# Patient Record
Sex: Female | Born: 1995
Health system: Southern US, Community
[De-identification: ages and names within clinical notes are randomized; demographics above are authoritative.]

## PROBLEM LIST (undated history)

## (undated) ENCOUNTER — Emergency Department (HOSPITAL_COMMUNITY): Admission: EM | Payer: Medicaid Other | Source: Home / Self Care

## (undated) DIAGNOSIS — K529 Noninfective gastroenteritis and colitis, unspecified: Secondary | ICD-10-CM

## (undated) DIAGNOSIS — K5900 Constipation, unspecified: Secondary | ICD-10-CM

## (undated) DIAGNOSIS — Q796 Ehlers-Danlos syndrome, unspecified: Secondary | ICD-10-CM

## (undated) DIAGNOSIS — K219 Gastro-esophageal reflux disease without esophagitis: Secondary | ICD-10-CM

## (undated) DIAGNOSIS — S42309A Unspecified fracture of shaft of humerus, unspecified arm, initial encounter for closed fracture: Secondary | ICD-10-CM

## (undated) DIAGNOSIS — F329 Major depressive disorder, single episode, unspecified: Secondary | ICD-10-CM

## (undated) DIAGNOSIS — G4700 Insomnia, unspecified: Secondary | ICD-10-CM

## (undated) DIAGNOSIS — R197 Diarrhea, unspecified: Secondary | ICD-10-CM

## (undated) DIAGNOSIS — F32A Depression, unspecified: Secondary | ICD-10-CM

## (undated) DIAGNOSIS — F938 Other childhood emotional disorders: Secondary | ICD-10-CM

## (undated) DIAGNOSIS — J45909 Unspecified asthma, uncomplicated: Secondary | ICD-10-CM

## (undated) DIAGNOSIS — F419 Anxiety disorder, unspecified: Secondary | ICD-10-CM

## (undated) HISTORY — DX: Anxiety disorder, unspecified: F41.9

## (undated) HISTORY — DX: Major depressive disorder, single episode, unspecified: F32.9

## (undated) HISTORY — DX: Depression, unspecified: F32.A

## (undated) HISTORY — DX: Constipation, unspecified: K59.00

## (undated) HISTORY — DX: Diarrhea, unspecified: R19.7

## (undated) HISTORY — DX: Gastro-esophageal reflux disease without esophagitis: K21.9

## (undated) HISTORY — PX: NO PAST SURGERIES: SHX2092

---
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
## 2018-08-18 ENCOUNTER — Telehealth: Payer: Self-pay | Admitting: General Surgery

## 2018-08-18 DIAGNOSIS — R1011 Right upper quadrant pain: Secondary | ICD-10-CM

## 2018-08-18 NOTE — Telephone Encounter (Signed)
Contacted the patients mother Bonita Quin at [phone] to pre-screen [facility] for new patient virtual appointment on 08/19/2018. Bonita Quin asked if I would call back later because she was on a zoom appt.

## 2018-08-19 ENCOUNTER — Other Ambulatory Visit: Payer: Self-pay

## 2018-08-19 ENCOUNTER — Ambulatory Visit (INDEPENDENT_AMBULATORY_CARE_PROVIDER_SITE_OTHER): Payer: Medicaid Other | Admitting: Gastroenterology

## 2018-08-19 VITALS — Ht 64.0 in | Wt 115.0 lb

## 2018-08-19 DIAGNOSIS — K582 Mixed irritable bowel syndrome: Secondary | ICD-10-CM

## 2018-08-19 DIAGNOSIS — R112 Nausea with vomiting, unspecified: Secondary | ICD-10-CM | POA: Diagnosis not present

## 2018-08-19 MED ORDER — ONDANSETRON HCL 4 MG PO TABS: 4.0000 mg | ORAL_TABLET | Freq: Two times a day (BID) | ORAL | 5 refills | Status: DC | PRN

## 2018-08-19 NOTE — Progress Notes (Signed)
This service was provided via virtual visit.  We tried connecting by [facility] audio and visual however this was not successful and so we went with audio only.  The patient was located at home.  I was located in my office.  The patient did consent to this virtual visit and is aware of possible charges through their insurance for this visit.  The patient is a new patient.  My certified medical assistant, Barbaraann Rondo, contributed to this visit by contacting the patient by phone 1 or 2 business days prior to the appointment and also followed up on the recommendations I made after the visit.  Time spent on virtual visit: 21 minutes   HPI: This is a very pleasant 23 year old woman  Nausea and vomiting.  For many years.  She has nausea often throughout the day.  She vomits sometimes 2-3 times per day. Ever since she was 63, since she started psych meds.  It sounds like she was evaluated for a variety of GI issues with abdominal pains, reflux, nausea and vomiting many years ago without clear diagnosis.  She believes that the symptoms are from her psych meds and certainly that may be the case.  She has been on a variety of antinausea medicines over the years and feels that the Solutab, dissolvable types worked the best.  She tells me she also has alternating constipation and diarrhea.  She can have no bowel movement for about 2 weeks and then diarrhea for 1 week.  Never sees blood in her stool.  Lithium, lamictal, gabapentin and Minipress all can cause nausea and vomiting   Weight down about 15 pounds in the past year.   she used to get heartburn quite a lot but that stopped years ago.  She does not need any antiacid medicines now.  No dysphagia.  She takes ibuprofen two 200mg  pills, usually works well. About twice per week.  She has alternating diarrhea, constipation.  1 week of diarrhea after 2 weeks of constipation.  Alternates.  She had an xray 22month ago and it showed she was full of stools  She  had an EGD 5-6 ago because of the same symptoms. She is not sure what it showed.  She does not know where it was done.  Chief complaint is chronic nausea, vomiting, alternating IBS  ROS: complete GI ROS as described in HPI, all other review negative.  Constitutional:  No unintentional weight loss   Past Medical History:  Diagnosis Date  . Anxiety   . Anxiety disorder of adolescence 06/09/2015  . Arm fracture   . Asthma   . Constipation    alternates with diarrhea  . Depression   . Diarrhea    alternates with constipation  . GERD (gastroesophageal reflux disease)   . Inflammatory bowel disease   . Insomnia 06/09/2015    Past Surgical History:  Procedure Laterality Date  . NO PAST SURGERIES      Current Outpatient Medications  Medication Sig Dispense Refill  . albuterol (PROVENTIL HFA;VENTOLIN HFA) 108 (90 BASE) MCG/ACT inhaler Inhale 2 puffs into the lungs every 6 (six) hours as needed for wheezing or shortness of breath.    . etonogestrel (NEXPLANON) 68 MG IMPL implant 1 each by Subdermal route once.    . gabapentin (NEURONTIN) 100 MG capsule Take 100 mg by mouth at bedtime.     . lamoTRIgine (LAMICTAL) 150 MG tablet Take 150 mg by mouth daily.    Marland Kitchen lithium 300 MG tablet Take 300 mg  by mouth 2 (two) times daily.    . prazosin (MINIPRESS) 1 MG capsule Take 1 mg by mouth at bedtime.     No current facility-administered medications for this visit.     Allergies as of 08/19/2018 - Review Complete 08/19/2018  Allergen Reaction Noted  . Bupropion Hives and Palpitations 08/19/2018    Family History  Problem Relation Age of Onset  . Drug abuse Mother   . Depression Mother   . Diabetes Maternal Grandmother   . Cancer Maternal Grandfather        leukemia  . Cancer Maternal Aunt        renal  . Depression Maternal Aunt   . Coronary artery disease Unknown   . Diabetes Unknown        paternal side  + hx diabetes  . Other Unknown        Brain Tumor  . Alcohol abuse  Maternal Aunt   . Depression Maternal Aunt   . Alcohol abuse Maternal Uncle   . Depression Maternal Uncle     Social History   Socioeconomic History  . Marital status: Single    Spouse name: Not on file  . Number of children: 0  . Years of education: 45  . Highest education level: Not on file  Occupational History  . Occupation: Freight forwarder  Social Needs  . Financial resource strain: Not on file  . Food insecurity:    Worry: Not on file    Inability: Not on file  . Transportation needs:    Medical: Not on file    Non-medical: Not on file  Tobacco Use  . Smoking status: Current Every Day Smoker    Packs/day: 0.25    Years: 1.00    Pack years: 0.25  . Smokeless tobacco: Never Used  Substance and Sexual Activity  . Alcohol use: No  . Drug use: No  . Sexual activity: Yes    Birth control/protection: Implant  Lifestyle  . Physical activity:    Days per week: Not on file    Minutes per session: Not on file  . Stress: Not on file  Relationships  . Social connections:    Talks on phone: Not on file    Gets together: Not on file    Attends religious service: Not on file    Active member of club or organization: Not on file    Attends meetings of clubs or organizations: Not on file    Relationship status: Not on file  . Intimate partner violence:    Fear of current or ex partner: Not on file    Emotionally abused: Not on file    Physically abused: Not on file    Forced sexual activity: Not on file  Other Topics Concern  . Not on file  Social History Narrative   Lives with mother, Bonita Quin   Caffeine use: 1/day - coffee/tea     Physical Exam: Unable to perform because this was a "telemed visit" due to current Covid-19 pandemic  Assessment and plan: 23 y.o. female with chronic nausea, vomiting and also alternating IBS  This certainly very possible that she is having side effects of her several psych meds.  She says she is not able to adjust any other doses and there  are no alternative regimens that she is aware of.  She does not really want to mess with those medicines and I completely understand that.  We will try to gather some information from her previous  GI work-ups including an EGD that she had sometime in the past 5 or 6 years however she is not really aware of where she had it done or what was found.  I recommended repeating that test now since certainly something may have changed in the interim.  She might have H. pylori infection, peptic ulcer disease, significant gastritis or reflux related damage.  We will give her prescription for dissolvable Zofran 4 mg tablets, 1 pill twice daily as needed for nausea and I am happy to refill that as needed.  I do think it is probably very important that she does not have significant vomiting so that she is able to keep her psych meds down and absorb them.  She will get a basic set of labs including a CBC, complete metabolic profile, thyroid testing and celiac sprue testing.  She will try fiber supplements for her alternating constipation and diarrhea that is likely alternating IBS.  Please see the "Patient Instructions" section for addition details about the plan.  Rob Buntinganiel Jacobs, MD [facility] 08/19/2018, 2:42 PM

## 2018-08-19 NOTE — Patient Instructions (Addendum)
We will try to track down her recent abdominal films done I believe in [place].  We will also see if we get records from her upper endoscopy done 5 or 6 years ago.  She is not really sure where this was done however.  [place]  She will have a basic set of blood work including a CBC, complete metabolic profile, TSH, celiac sprue testing with TTG and total IgA level.  Upper endoscopy at her soonest convenience as well for nausea, vomiting  New prescription for ondansetron 4 mg ODT, 1 tablet twice daily as needed for nausea, dispense 60 with 5 refills.  Please also recommend that she start taking Citrucel over-the-counter fiber supplement for her alternating constipation and diarrhea.  Thank you for entrusting me with your care and choosing [facility].  Dr Christella Hartigan

## 2018-08-20 ENCOUNTER — Telehealth: Payer: Self-pay

## 2018-08-20 NOTE — Telephone Encounter (Signed)
Patient Stated that she would come in today at 3pm for labs and to go over instructions and sign patient acknowledgement form for EGD 08/21/18. She called stating she was vomiting and felt too weak to make it in today. Patient said she will come in the day of her procedure at 930 am to have labs drawn and sign the form. Instructions verbalized over the phone. I also spoke to patients mother who will be her care partner. She assured me that patient will follow the instructions.

## 2018-08-21 ENCOUNTER — Ambulatory Visit (AMBULATORY_SURGERY_CENTER): Payer: Medicaid Other | Admitting: Gastroenterology

## 2018-08-21 ENCOUNTER — Other Ambulatory Visit: Payer: Self-pay

## 2018-08-21 ENCOUNTER — Other Ambulatory Visit (INDEPENDENT_AMBULATORY_CARE_PROVIDER_SITE_OTHER): Payer: Medicaid Other

## 2018-08-21 ENCOUNTER — Encounter: Payer: Self-pay | Admitting: Gastroenterology

## 2018-08-21 VITALS — BP 108/69 | HR 72 | Temp 99.1°F | Resp 20 | Ht 64.0 in | Wt 115.0 lb

## 2018-08-21 DIAGNOSIS — R112 Nausea with vomiting, unspecified: Secondary | ICD-10-CM | POA: Diagnosis not present

## 2018-08-21 DIAGNOSIS — K582 Mixed irritable bowel syndrome: Secondary | ICD-10-CM

## 2018-08-21 LAB — COMPREHENSIVE METABOLIC PANEL
ALT: 11 U/L (ref 0–35)
AST: 15 U/L (ref 0–37)
Albumin: 4.7 g/dL (ref 3.5–5.2)
Alkaline Phosphatase: 66 U/L (ref 39–117)
BUN: 7 mg/dL (ref 6–23)
CO2: 27 mEq/L (ref 19–32)
Calcium: 9.7 mg/dL (ref 8.4–10.5)
Chloride: 103 mEq/L (ref 96–112)
Creatinine, Ser: 0.8 mg/dL (ref 0.40–1.20)
GFR: 91.07 mL/min (ref >60.00)
Glucose, Bld: 89 mg/dL (ref 70–99)
Potassium: 4 mEq/L (ref 3.5–5.1)
Sodium: 138 mEq/L (ref 135–145)
Total Bilirubin: 0.3 mg/dL (ref 0.2–1.2)
Total Protein: 7.4 g/dL (ref 6.0–8.3)

## 2018-08-21 LAB — CBC WITH DIFFERENTIAL/PLATELET
Basophils Absolute: 0.1 10*3/uL (ref 0.0–0.1)
Basophils Relative: 1.1 % (ref 0.0–3.0)
Eosinophils Absolute: 0.2 10*3/uL (ref 0.0–0.7)
Eosinophils Relative: 3.4 % (ref 0.0–5.0)
HCT: 40.3 % (ref 36.0–46.0)
Hemoglobin: 14.1 g/dL (ref 12.0–15.0)
Lymphocytes Relative: 35.4 % (ref 12.0–46.0)
Lymphs Abs: 2.1 10*3/uL (ref 0.7–4.0)
MCHC: 35.1 g/dL (ref 30.0–36.0)
MCV: 86.7 fl (ref 78.0–100.0)
Monocytes Absolute: 0.4 10*3/uL (ref 0.1–1.0)
Monocytes Relative: 7 % (ref 3.0–12.0)
Neutro Abs: 3.1 10*3/uL (ref 1.4–7.7)
Neutrophils Relative %: 53.1 % (ref 43.0–77.0)
Platelets: 347 10*3/uL (ref 150.0–400.0)
RBC: 4.65 Mil/uL (ref 3.87–5.11)
RDW: 12.8 % (ref 11.5–14.6)
WBC: 5.8 10*3/uL (ref 4.5–10.5)

## 2018-08-21 LAB — IGA: IgA: 186 mg/dL (ref 68–378)

## 2018-08-21 LAB — TSH: TSH: 1.04 u[IU]/mL (ref 0.35–5.50)

## 2018-08-21 MED ORDER — SODIUM CHLORIDE 0.9 % IV SOLN: 500.0000 mL | Freq: Once | INTRAVENOUS | Status: DC

## 2018-08-21 NOTE — Progress Notes (Signed)
Courtney Washington CMA temps and Judy Branson CMA vitals 

## 2018-08-21 NOTE — Progress Notes (Signed)
Report to PACU, RN, vss, BBS= Clear.  

## 2018-08-21 NOTE — Op Note (Addendum)
[facility] Patient Name: Dana [name] Procedure Date: 08/21/2018 11:12 AM MRN: [id number] Endoscopist: Rachael Fee , MD Age: 23 Referring MD:  Date of Birth: [birth year] Gender: Female Account #: [id number] Procedure:                Upper GI endoscopy Indications:              Nausea with vomiting Medicines:                Monitored Anesthesia Care Procedure:                Pre-Anesthesia Assessment:                           - Prior to the procedure, a History and Physical                            was performed, and patient medications and                            allergies were reviewed. The patient's tolerance of                            previous anesthesia was also reviewed. The risks                            and benefits of the procedure and the sedation                            options and risks were discussed with the patient.                            All questions were answered, and informed consent                            was obtained. Prior Anticoagulants: The patient has                            taken no previous anticoagulant or antiplatelet                            agents. ASA Grade Assessment: I - A normal, healthy                            patient. After reviewing the risks and benefits,                            the patient was deemed in satisfactory condition to                            undergo the procedure.                           After obtaining informed consent, the endoscope was  passed under direct vision. Throughout the                            procedure, the patient's blood pressure, pulse, and                            oxygen saturations were monitored continuously. The                            Model GIF-[id number]) scope was introduced                            through the mouth, and advanced to the second part                            of duodenum. The upper GI endoscopy was                            accomplished without difficulty. The patient                            tolerated the procedure well. Scope In: Scope Out: Findings:                 The esophagus was normal.                           The stomach was normal.                           The examined duodenum was normal. Complications:            No immediate complications. Estimated blood loss:                            None. Estimated Blood Loss:     Estimated blood loss: none. Impression:               - Normal esophagus.                           - Normal stomach.                           - Normal examined duodenum. Recommendation:           - Patient has a contact number available for                            emergencies. The signs and symptoms of potential                            delayed complications were discussed with the                            patient. Return to normal activities tomorrow.  Written discharge instructions were provided to the                            patient.                           - Resume previous diet.                           - Continue present medications. Please start the                            zofran prescription today or tomorrow.                           - Await results of your blood tests. Rachael Feeaniel P Quinn Quam, MD 08/21/2018 11:25:53 AM This report has been signed electronically.

## 2018-08-21 NOTE — Patient Instructions (Signed)
YOU HAD AN ENDOSCOPIC PROCEDURE TODAY AT THE [facility]:   Refer to the procedure report that was given to you for any specific questions about what was found during the examination.  If the procedure report does not answer your questions, please call your gastroenterologist to clarify.  If you requested that your care partner not be given the details of your procedure findings, then the procedure report has been included in a sealed envelope for you to review at your convenience later.  YOU SHOULD EXPECT: Some feelings of bloating in the abdomen. Passage of more gas than usual.  Walking can help get rid of the air that was put into your GI tract during the procedure and reduce the bloating. If you had a lower endoscopy (such as a colonoscopy or flexible sigmoidoscopy) you may notice spotting of blood in your stool or on the toilet paper. If you underwent a bowel prep for your procedure, you may not have a normal bowel movement for a few days.  Please Note:  You might notice some irritation and congestion in your nose or some drainage.  This is from the oxygen used during your procedure.  There is no need for concern and it should clear up in a day or so.  SYMPTOMS TO REPORT IMMEDIATELY:   Following upper endoscopy (EGD)  Vomiting of blood or coffee ground material  New chest pain or pain under the shoulder blades  Painful or persistently difficult swallowing  New shortness of breath  Fever of 100F or higher  Black, tarry-looking stools  For urgent or emergent issues, a gastroenterologist can be reached at any hour by calling [phone].   DIET:  We do recommend a small meal at first, but then you may proceed to your regular diet.  Drink plenty of fluids but you should avoid alcoholic beverages for 24 hours.  ACTIVITY:  You should plan to take it easy for the rest of today and you should NOT DRIVE or use heavy machinery until tomorrow (because of the sedation medicines used  during the test).    FOLLOW UP: Our staff will call the number listed on your records 48-72 hours following your procedure to check on you and address any questions or concerns that you may have regarding the information given to you following your procedure. If we do not reach you, we will leave a message.  We will attempt to reach you two times.  During this call, we will ask if you have developed any symptoms of COVID 19. If you develop any symptoms (ie: fever, flu-like symptoms, shortness of breath, cough etc.) before then, please call [phone].  If you test positive for Covid 19 in the 2 weeks post procedure, please call and report this information to us.    If any biopsies were taken you will be contacted by phone or by letter within the next 1-3 weeks.  Please call us at [phone] if you have not heard about the biopsies in 3 weeks.    SIGNATURES/CONFIDENTIALITY: You and/or your care partner have signed paperwork which will be entered into your electronic medical record.  These signatures attest to the fact that that the information above on your After Visit Summary has been reviewed and is understood.  Full responsibility of the confidentiality of this discharge information lies with you and/or your care-partner. 

## 2018-08-25 ENCOUNTER — Telehealth: Payer: Self-pay

## 2018-08-25 LAB — TISSUE TRANSGLUTAMINASE, IGA: (tTG) Ab, IgA: 1 U/mL

## 2018-08-25 NOTE — Telephone Encounter (Signed)
Second follow up phone call attempt, no answer, message left 

## 2018-08-25 NOTE — Telephone Encounter (Signed)
NO ANSWER, MESSAGE LEFT FOR PATIENT. 

## 2018-08-26 NOTE — Progress Notes (Deleted)
erroneuous

## 2018-08-26 NOTE — Patient Instructions (Signed)
Erroneous encounter

## 2018-08-27 NOTE — Progress Notes (Signed)
Erroneous encounter

## 2019-03-11 ENCOUNTER — Emergency Department (HOSPITAL_BASED_OUTPATIENT_CLINIC_OR_DEPARTMENT_OTHER): Payer: Medicaid Other

## 2019-03-11 ENCOUNTER — Encounter (HOSPITAL_BASED_OUTPATIENT_CLINIC_OR_DEPARTMENT_OTHER): Payer: Self-pay

## 2019-03-11 ENCOUNTER — Emergency Department (HOSPITAL_BASED_OUTPATIENT_CLINIC_OR_DEPARTMENT_OTHER)
Admission: EM | Admit: 2019-03-11 | Discharge: 2019-03-11 | Disposition: A | Discharge status: Home / Self Care | Payer: Medicaid Other | Attending: Emergency Medicine | Admitting: Emergency Medicine

## 2019-03-11 ENCOUNTER — Other Ambulatory Visit: Payer: Self-pay

## 2019-03-11 DIAGNOSIS — Z87891 Personal history of nicotine dependence: Secondary | ICD-10-CM | POA: Insufficient documentation

## 2019-03-11 DIAGNOSIS — R102 Pelvic and perineal pain: Secondary | ICD-10-CM

## 2019-03-11 DIAGNOSIS — J45909 Unspecified asthma, uncomplicated: Secondary | ICD-10-CM | POA: Insufficient documentation

## 2019-03-11 DIAGNOSIS — R1031 Right lower quadrant pain: Secondary | ICD-10-CM | POA: Diagnosis not present

## 2019-03-11 DIAGNOSIS — R109 Unspecified abdominal pain: Secondary | ICD-10-CM | POA: Diagnosis present

## 2019-03-11 LAB — URINALYSIS, ROUTINE W REFLEX MICROSCOPIC
Bilirubin Urine: NEGATIVE
Glucose, UA: NEGATIVE mg/dL
Hgb urine dipstick: NEGATIVE
Ketones, ur: NEGATIVE mg/dL
Nitrite: NEGATIVE
Protein, ur: NEGATIVE mg/dL
Specific Gravity, Urine: <1.005 — ABNORMAL LOW (ref 1.005–1.030)
pH: 6 (ref 5.0–8.0)

## 2019-03-11 LAB — CBC
HCT: 41.7 % (ref 36.0–46.0)
Hemoglobin: 13.7 g/dL (ref 12.0–15.0)
MCH: 29.5 pg (ref 26.0–34.0)
MCHC: 32.9 g/dL (ref 30.0–36.0)
MCV: 89.7 fL (ref 80.0–100.0)
Platelets: 315 10*3/uL (ref 150–400)
RBC: 4.65 MIL/uL (ref 3.87–5.11)
RDW: 11.9 % (ref 11.5–15.5)
WBC: 6.1 10*3/uL (ref 4.0–10.5)
nRBC: 0 % (ref 0.0–0.2)

## 2019-03-11 LAB — COMPREHENSIVE METABOLIC PANEL
ALT: 16 U/L (ref 0–44)
AST: 20 U/L (ref 15–41)
Albumin: 4.5 g/dL (ref 3.5–5.0)
Alkaline Phosphatase: 63 U/L (ref 38–126)
Anion gap: 9 (ref 5–15)
BUN: 9 mg/dL (ref 6–20)
CO2: 24 mmol/L (ref 22–32)
Calcium: 9.4 mg/dL (ref 8.9–10.3)
Chloride: 104 mmol/L (ref 98–111)
Creatinine, Ser: 0.86 mg/dL (ref 0.44–1.00)
GFR calc Af Amer: >60 mL/min (ref >60)
GFR calc non Af Amer: >60 mL/min (ref >60)
Glucose, Bld: 112 mg/dL — ABNORMAL HIGH (ref 70–99)
Potassium: 3.9 mmol/L (ref 3.5–5.1)
Sodium: 137 mmol/L (ref 135–145)
Total Bilirubin: 0.8 mg/dL (ref 0.3–1.2)
Total Protein: 7.2 g/dL (ref 6.5–8.1)

## 2019-03-11 LAB — URINALYSIS, MICROSCOPIC (REFLEX): RBC / HPF: NONE SEEN RBC/hpf (ref 0–5)

## 2019-03-11 LAB — PREGNANCY, URINE: Preg Test, Ur: NEGATIVE

## 2019-03-11 MED ORDER — IOHEXOL 300 MG/ML  SOLN
100.0000 mL | Freq: Once | INTRAMUSCULAR | Status: AC | PRN
  Administered 2019-03-11: 100 mL via INTRAVENOUS

## 2019-03-11 MED ORDER — NAPROXEN 500 MG PO TABS: 500.0000 mg | ORAL_TABLET | Freq: Two times a day (BID) | ORAL | 0 refills | Status: DC

## 2019-03-11 MED ORDER — KETOROLAC TROMETHAMINE 15 MG/ML IJ SOLN
15.0000 mg | Freq: Once | INTRAMUSCULAR | Status: AC
  Administered 2019-03-11: 15 mg via INTRAVENOUS
  Filled 2019-03-11: qty 1

## 2019-03-11 MED FILL — NAPROXEN 500 MG TABS: 500 | 15 days supply | Qty: 30 | Fill #0

## 2019-03-11 NOTE — ED Notes (Signed)
Pt in US

## 2019-03-11 NOTE — Discharge Instructions (Addendum)
You were evaluated in the Emergency Department and after careful evaluation, we did not find any emergent condition requiring admission or further testing in the hospital.  Your exam/testing today is overall reassuring.  Your ultrasounds and CT scans were normal.  Please use the anti-inflammatory medication provided as directed and follow-up with your regular doctors.  Please return to the Emergency Department if you experience any worsening of your condition.  We encourage you to follow up with a primary care provider.  Thank you for allowing [place] to be a part of your care.

## 2019-03-11 NOTE — ED Triage Notes (Signed)
PT c/o RLQ pain states that she has history of ovarian cyst. States that OTC pain medication is no longer helping.

## 2019-03-11 NOTE — ED Provider Notes (Signed)
[facility] Emergency Department Provider Note MRN:  [id number]  Arrival date & time: 03/11/19     Chief Complaint   Abdominal Pain   History of Present Illness   Dana [name] is a 23 y.o. year-old female with a history of ovarian cyst presenting to the ED with chief complaint of abdominal pain.  Location: Right lower quadrant Duration: 1 year but much worse over the past 2 or 3 days Onset: Sudden Timing: Constant Description: Sharp Severity: Moderate Exacerbating/Alleviating Factors: None Associated Symptoms: Nausea, malaise, poor appetite Pertinent Negatives: Denies headache, no fever, no chest pain, shortness of breath, no upper abdominal pain, no vaginal bleeding or discharge   Review of Systems  A complete 10 system review of systems was obtained and all systems are negative except as noted in the HPI and PMH.   Patient's Health History    Past Medical History:  Diagnosis Date  . Anxiety   . Anxiety disorder of adolescence 06/09/2015  . Arm fracture   . Asthma   . Constipation    alternates with diarrhea  . Depression   . Diarrhea    alternates with constipation  . GERD (gastroesophageal reflux disease)   . IBS   . Insomnia 06/09/2015    Past Surgical History:  Procedure Laterality Date  . NO PAST SURGERIES      Family History  Problem Relation Age of Onset  . Drug abuse Mother   . Depression Mother   . Diabetes Maternal Grandmother   . Cancer Maternal Grandfather        leukemia  . Cancer Maternal Aunt        renal  . Depression Maternal Aunt   . Coronary artery disease Other   . Diabetes Other        paternal side  + hx diabetes  . Other Other        Brain Tumor  . Alcohol abuse Maternal Aunt   . Depression Maternal Aunt   . Alcohol abuse Maternal Uncle   . Depression Maternal Uncle   . Stomach cancer Neg Hx   . Esophageal cancer Neg Hx     Social History   Socioeconomic History  . Marital status: Single    Spouse name: Not on file  . Number of children: 0  . Years of education: 62  . Highest education level: Not on file  Occupational History  . Occupation: [employer]  Tobacco Use  . Smoking status: Former Smoker    Packs/day: 0.25    Years: 1.00    Pack years: 0.25  . Smokeless tobacco: Never Used  Substance and Sexual Activity  . Alcohol use: No  . Drug use: No  . Sexual activity: Yes    Birth control/protection: Implant  Other Topics Concern  . Not on file  Social History Narrative   Lives with mother, Bonita Quin   Caffeine use: 1/day - coffee/tea   Social Determinants of Health   Financial Resource Strain:   . Difficulty of Paying Living Expenses: Not on file  Food Insecurity:   . Worried About Programme researcher, broadcasting/film/video in the Last Year: Not on file  . Ran Out of Food in the Last Year: Not on file  Transportation Needs:   . Lack of Transportation (Medical): Not on file  . Lack of Transportation (Non-Medical): Not on file  Physical Activity:   . Days of Exercise per Week: Not on file  . Minutes of Exercise per Session:  Not on file  Stress:   . Feeling of Stress : Not on file  Social Connections:   . Frequency of Communication with Friends and Family: Not on file  . Frequency of Social Gatherings with Friends and Family: Not on file  . Attends Religious Services: Not on file  . Active Member of Clubs or Organizations: Not on file  . Attends Archivist Meetings: Not on file  . Marital Status: Not on file  Intimate Partner Violence:   . Fear of Current or Ex-Partner: Not on file  . Emotionally Abused: Not on file  . Physically Abused: Not on file  . Sexually Abused: Not on file     Physical Exam  Vital Signs and Nursing Notes reviewed Vitals:   03/11/19 1145  BP: (!) 135/91  Pulse: 85  Resp: 16  Temp: 98.6 F (37 C)  SpO2: 100%    CONSTITUTIONAL: Well-appearing, NAD NEURO:  Alert and oriented x 3, no focal deficits EYES:  eyes equal and reactive ENT/NECK:   no LAD, no JVD CARDIO: Regular rate, well-perfused, normal S1 and S2 PULM:  CTAB no wheezing or rhonchi GI/GU:  normal bowel sounds, non-distended, moderate right lower quadrant tenderness to palpation MSK/SPINE:  No gross deformities, no edema SKIN:  no rash, atraumatic PSYCH:  Appropriate speech and behavior  Diagnostic and Interventional Summary    EKG Interpretation  Date/Time:    Ventricular Rate:    PR Interval:    QRS Duration:   QT Interval:    QTC Calculation:   R Axis:     Text Interpretation:        Labs Reviewed  COMPREHENSIVE METABOLIC PANEL - Abnormal; Notable for the following components:      Result Value   Glucose, Bld 112 (*)    All other components within normal limits  URINALYSIS, ROUTINE W REFLEX MICROSCOPIC - Abnormal; Notable for the following components:   Specific Gravity, Urine <1.005 (*)    Leukocytes,Ua TRACE (*)    All other components within normal limits  URINALYSIS, MICROSCOPIC (REFLEX) - Abnormal; Notable for the following components:   Bacteria, UA FEW (*)    All other components within normal limits  CBC  PREGNANCY, URINE    CT Abdomen Pelvis W Contrast  Final Result    US APPENDIX (ABDOMEN LIMITED)  Final Result    [place] Art/Ven Flow Abd Pelv Doppler  Final Result      Medications  ketorolac (TORADOL) 15 MG/ML injection 15 mg (15 mg Intravenous Given 03/11/19 1357)  iohexol (OMNIPAQUE) 300 MG/ML solution 100 mL (100 mLs Intravenous Contrast Given 03/11/19 1427)     Procedures  /  Critical Care Procedures  ED Course and Medical Decision Making  I have reviewed the triage vital signs and the nursing notes.  Pertinent labs & imaging results that were available during my care of the patient were reviewed by me and considered in my medical decision making (see below for details).     Question of ovarian cyst versus bursitis versus less likely ovarian torsion.  Moderate tenderness on exam.  History of similar pain to this area  for 1 year related to ovarian cyst.  Patient is thin, will evaluate adnexa and appendix with ultrasound.  May need CT imaging if equivocal.  1:32 PM update: Ultrasound is unable to visualize the appendix.  Ultrasound does not show any evidence of torsion, also does not show any evidence of ovarian cyst.  Will need CT to further evaluate  as patient still has pain and tenderness.  2:59 PM update: CT scan is without acute pathology, normal appendix.  Patient is feeling better on exam after Toradol.  Has had normal vital signs during her ED visit, labs reassuring.  Unclear etiology but patient does have chronic pain in this area.  Pelvic exam recommended to further evaluate the patient explains that she had a pelvic exam 1 week ago and has no vaginal complaints and defers pelvic exam at this time.  Elmer SowMichael M. Pilar PlateBero, MD [facility] [email or web address]  Final Clinical Impressions(s) / ED Diagnoses     ICD-10-CM   1. RLQ abdominal pain  R10.31 US APPENDIX (ABDOMEN LIMITED)    US APPENDIX (ABDOMEN LIMITED)  2. Pelvic pain  R10.2 CANCELED: US PELVIC COMPLETE W TRANSVAGINAL AND TORSION R/O    CANCELED: US PELVIC COMPLETE W TRANSVAGINAL AND TORSION R/O    ED Discharge Orders         Ordered    naproxen (NAPROSYN) 500 MG tablet  2 times daily     03/11/19 1457           Discharge Instructions Discussed with and Provided to Patient:     Discharge Instructions     You were evaluated in the Emergency Department and after careful evaluation, we did not find any emergent condition requiring admission or further testing in the hospital.  Your exam/testing today is overall reassuring.  Your ultrasounds and CT scans were normal.  Please use the anti-inflammatory medication provided as directed and follow-up with your regular doctors.  Please return to the Emergency Department if you experience any worsening of your condition.  We encourage you to follow  up with a primary care provider.  Thank you for allowing us to be a part of your care.       Sabas SousBero, Elizjah Noblet M, MD 03/11/19 1459

## 2019-04-23 ENCOUNTER — Encounter (HOSPITAL_BASED_OUTPATIENT_CLINIC_OR_DEPARTMENT_OTHER): Payer: Self-pay | Admitting: *Deleted

## 2019-04-23 ENCOUNTER — Emergency Department (HOSPITAL_BASED_OUTPATIENT_CLINIC_OR_DEPARTMENT_OTHER)
Admission: EM | Admit: 2019-04-23 | Discharge: 2019-04-23 | Disposition: A | Discharge status: Home / Self Care | Payer: Medicaid Other | Attending: Emergency Medicine | Admitting: Emergency Medicine

## 2019-04-23 ENCOUNTER — Other Ambulatory Visit: Payer: Self-pay

## 2019-04-23 DIAGNOSIS — Z79899 Other long term (current) drug therapy: Secondary | ICD-10-CM | POA: Insufficient documentation

## 2019-04-23 DIAGNOSIS — X500XXA Overexertion from strenuous movement or load, initial encounter: Secondary | ICD-10-CM | POA: Insufficient documentation

## 2019-04-23 DIAGNOSIS — Z87891 Personal history of nicotine dependence: Secondary | ICD-10-CM | POA: Insufficient documentation

## 2019-04-23 DIAGNOSIS — Y93K9 Activity, other involving animal care: Secondary | ICD-10-CM | POA: Diagnosis not present

## 2019-04-23 DIAGNOSIS — Y990 Civilian activity done for income or pay: Secondary | ICD-10-CM | POA: Diagnosis not present

## 2019-04-23 DIAGNOSIS — Z888 Allergy status to other drugs, medicaments and biological substances status: Secondary | ICD-10-CM | POA: Diagnosis not present

## 2019-04-23 DIAGNOSIS — S46812A Strain of other muscles, fascia and tendons at shoulder and upper arm level, left arm, initial encounter: Secondary | ICD-10-CM | POA: Insufficient documentation

## 2019-04-23 DIAGNOSIS — Y929 Unspecified place or not applicable: Secondary | ICD-10-CM | POA: Diagnosis not present

## 2019-04-23 DIAGNOSIS — J45909 Unspecified asthma, uncomplicated: Secondary | ICD-10-CM | POA: Insufficient documentation

## 2019-04-23 DIAGNOSIS — S199XXA Unspecified injury of neck, initial encounter: Secondary | ICD-10-CM | POA: Diagnosis present

## 2019-04-23 MED ORDER — OXYCODONE HCL 5 MG PO TABS
5.0000 mg | ORAL_TABLET | Freq: Once | ORAL | Status: AC
  Administered 2019-04-23: 5 mg via ORAL
  Filled 2019-04-23: qty 1

## 2019-04-23 MED ORDER — DIAZEPAM 5 MG PO TABS
5.0000 mg | ORAL_TABLET | Freq: Once | ORAL | Status: AC
  Administered 2019-04-23: 5 mg via ORAL
  Filled 2019-04-23: qty 1

## 2019-04-23 MED ORDER — ACETAMINOPHEN 500 MG PO TABS
1000.0000 mg | ORAL_TABLET | Freq: Once | ORAL | Status: AC
  Administered 2019-04-23: 1000 mg via ORAL
  Filled 2019-04-23: qty 2

## 2019-04-23 MED ORDER — KETOROLAC TROMETHAMINE 15 MG/ML IJ SOLN
15.0000 mg | Freq: Once | INTRAMUSCULAR | Status: AC
  Administered 2019-04-23: 15 mg via INTRAMUSCULAR
  Filled 2019-04-23: qty 1

## 2019-04-23 NOTE — Discharge Instructions (Signed)
Take 4 over the counter ibuprofen tablets 3 times a day or 2 over-the-counter naproxen tablets twice a day for pain. Also take tylenol 1000mg(2 extra strength) four times a day.    

## 2019-04-23 NOTE — ED Provider Notes (Signed)
[facility] EMERGENCY DEPARTMENT Provider Note   CSN: [id number] Arrival date & time: 04/23/19  1643     History Chief Complaint  Patient presents with  . Torticollis    Dana [name] is a 24 y.o. female.  24 yo F with a chief complaint of left sided neck pain.  Going on for a few days.  Has been trying Tylenol without improvement.  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  Dana [name] denies trauma to the neck.  Denies fevers.  Feels that the pain starts at the left side of her neck and radiates down the arm.  The history is provided by the patient.  Illness Severity:  Moderate Onset quality:  Gradual Duration:  1 week Timing:  Constant Progression:  Worsening Chronicity:  New Associated symptoms: no chest pain, no congestion, no fever, no headaches, no myalgias, no nausea, no rhinorrhea, no shortness of breath, no vomiting and no wheezing        Past Medical History:  Diagnosis Date  . Anxiety   . Anxiety disorder of adolescence 06/09/2015  . Arm fracture   . Asthma   . Constipation    alternates with diarrhea  . Depression   . Diarrhea    alternates with constipation  . GERD (gastroesophageal reflux disease)   . Inflammatory bowel disease   . Insomnia 06/09/2015    Patient Active Problem List   Diagnosis Date Noted  . Suicidal ideation 12/19/2015  . MDD (major depressive disorder), recurrent episode, severe ([place]) 12/18/2015  . Insomnia 06/09/2015  . Anxiety disorder of adolescence 06/09/2015  . Severe episode of recurrent major depressive disorder, without psychotic features ([place]) 06/02/2015  . Alternating constipation and diarrhea   . Nausea & vomiting 09/29/2011  . IBS (irritable bowel syndrome) 06/28/2011  . GERD (gastroesophageal reflux disease) 06/28/2011  . Back pain 08/01/2010  . DYSURIA 08/17/2009  . ACUTE PHARYNGITIS 04/06/2009  . ACUTE BRONCHITIS  04/06/2009  . GASTRITIS, ACUTE 04/27/2007  . ANXIETY 02/11/2007  . ALLERGIC RHINITIS 02/11/2007  . GERD 12/25/2006  . LOOSE STOOLS 12/25/2006  . RUQ PAIN 12/25/2006    Past Surgical History:  Procedure Laterality Date  . NO PAST SURGERIES       OB History   No obstetric history on file.     Family History  Problem Relation Age of Onset  . Drug abuse Mother   . Depression Mother   . Diabetes Maternal Grandmother   . Cancer Maternal Grandfather        leukemia  . Cancer Maternal Aunt        renal  . Depression Maternal Aunt   . Coronary artery disease Other   . Diabetes Other        paternal side  + hx diabetes  . Other Other        Brain Tumor  . Alcohol abuse Maternal Aunt   . Depression Maternal Aunt   . Alcohol abuse Maternal Uncle   . Depression Maternal Uncle   . Stomach cancer Neg Hx   . Esophageal cancer Neg Hx     Social History   Tobacco Use  . Smoking status: Former Smoker    Packs/day: 0.25    Years: 1.00    Pack years: 0.25  . Smokeless tobacco: Never Used  Substance Use Topics  . Alcohol use: No  . Drug use:  No    Home Medications Prior to Admission medications   Medication Sig Start Date End Date Taking? Authorizing Provider  albuterol (PROVENTIL HFA;VENTOLIN HFA) 108 (90 BASE) MCG/ACT inhaler Inhale 2 puffs into the lungs every 6 (six) hours as needed for wheezing or shortness of breath.    [provider]  etonogestrel (NEXPLANON) 68 MG IMPL implant 1 each by Subdermal route once.    [provider]  gabapentin (NEURONTIN) 100 MG capsule Take 100 mg by mouth at bedtime.     [provider]  lamoTRIgine (LAMICTAL) 150 MG tablet Take 150 mg by mouth daily.    [provider]  lithium 300 MG tablet Take 300 mg by mouth 2 (two) times daily.    [provider]  naproxen (NAPROSYN) 500 MG tablet Take 1 tablet (500 mg total) by mouth 2 (two) times daily. 03/11/19   Sabas Sous, MD  ondansetron  (ZOFRAN) 4 MG tablet Take 1 tablet (4 mg total) by mouth 2 (two) times daily as needed for nausea or vomiting. Patient not taking: Reported on 08/21/2018 08/19/18   Rachael Fee, MD  prazosin (MINIPRESS) 1 MG capsule Take 1 mg by mouth at bedtime.    [provider]    Allergies    Bupropion  Review of Systems   Review of Systems  Constitutional: Negative for chills and fever.  HENT: Negative for congestion and rhinorrhea.   Eyes: Negative for redness and visual disturbance.  Respiratory: Negative for shortness of breath and wheezing.   Cardiovascular: Negative for chest pain and palpitations.  Gastrointestinal: Negative for nausea and vomiting.  Genitourinary: Negative for dysuria and urgency.  Musculoskeletal: Positive for arthralgias and neck pain. Negative for myalgias.  Skin: Negative for pallor and wound.  Neurological: Negative for dizziness and headaches.    Physical Exam Updated Vital Signs BP 130/78   Pulse 75   Temp 98.3 F (36.8 C) (Oral)   Resp 18   Ht 5\' 3"  (1.6 m)   Wt 53.5 kg   SpO2 100%   BMI 20.90 kg/m   Physical Exam Vitals and nursing note reviewed.  Constitutional:      General: Dana [name] is not in acute distress.    Appearance: Dana [name] is well-developed. Dana [name] is not diaphoretic.  HENT:     Head: Normocephalic and atraumatic.  Eyes:     Pupils: Pupils are equal, round, and reactive to light.  Neck:      Comments: Pain diffusely about the trapezius muscle belly worse more towards midline.  No significant midline bony tenderness.  Worse laterally.  Pulse motor and sensation intact distally. Cardiovascular:     Rate and Rhythm: Normal rate and regular rhythm.     Heart sounds: No murmur. No friction rub. No gallop.   Pulmonary:     Effort: Pulmonary effort is normal.     Breath sounds: No wheezing or rales.  Abdominal:     General: There is no distension.     Palpations: Abdomen is soft.     Tenderness: There is no abdominal tenderness.    Musculoskeletal:        General: No tenderness.     Cervical back: Normal range of motion and neck supple.  Skin:    General: Skin is warm and dry.  Neurological:     Mental Status: Dana [name] is alert and oriented to person, place, and time.  Psychiatric:        Behavior: Behavior normal.  ED Results / Procedures / Treatments   Labs (all labs ordered are listed, but only abnormal results are displayed) Labs Reviewed - No data to display  EKG None  Radiology No results found.  Procedures Procedures (including critical care time)  Medications Ordered in ED Medications  acetaminophen (TYLENOL) tablet 1,000 mg (has no administration in time range)  ketorolac (TORADOL) 15 MG/ML injection 15 mg (has no administration in time range)  oxyCODONE (Oxy IR/ROXICODONE) immediate release tablet 5 mg (has no administration in time range)  diazepam (VALIUM) tablet 5 mg (has no administration in time range)    ED Course  I have reviewed the triage vital signs and the nursing notes.  Pertinent labs & imaging results that were available during my care of the patient were reviewed by me and considered in my medical decision making (see chart for details).    MDM Rules/Calculators/A&P                      24 yo F with a chief complaints of left-sided neck pain.  Most likely the patient has trapezius spasm based on history and exam.  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  We will write her a note for limited duty at work.  Treat her pain here.  Tylenol and NSAIDs at home.  PCP follow-up.  5:13 PM:  I have discussed the diagnosis/risks/treatment options with the patient and believe the pt to be eligible for discharge home to follow-up with PCP. We also discussed returning to the ED immediately if new or worsening sx occur. We discussed the sx which are most concerning (e.g., sudden worsening pain, fever,  inability to tolerate by mouth) that necessitate immediate return. Medications administered to the patient during their visit and any new prescriptions provided to the patient are listed below.  Medications given during this visit Medications  acetaminophen (TYLENOL) tablet 1,000 mg (has no administration in time range)  ketorolac (TORADOL) 15 MG/ML injection 15 mg (has no administration in time range)  oxyCODONE (Oxy IR/ROXICODONE) immediate release tablet 5 mg (has no administration in time range)  diazepam (VALIUM) tablet 5 mg (has no administration in time range)     The patient appears reasonably screen and/or stabilized for discharge and I doubt any other medical condition or other [facility] requiring further screening, evaluation, or treatment in the ED at this time prior to discharge.   Final Clinical Impression(s) / ED Diagnoses Final diagnoses:  Trapezius strain, left, initial encounter    Rx / DC Orders ED Discharge Orders    None       Melene Plan, DO 04/23/19 1713

## 2019-04-23 NOTE — ED Triage Notes (Signed)
Pt c/o stiff neck and pain with movt that radiates down her left arm x 1 day

## 2020-03-25 DIAGNOSIS — Z419 Encounter for procedure for purposes other than remedying health state, unspecified: Secondary | ICD-10-CM | POA: Diagnosis not present

## 2020-04-17 DIAGNOSIS — R509 Fever, unspecified: Secondary | ICD-10-CM | POA: Diagnosis not present

## 2020-04-17 DIAGNOSIS — R112 Nausea with vomiting, unspecified: Secondary | ICD-10-CM | POA: Diagnosis not present

## 2020-04-17 DIAGNOSIS — Z20822 Contact with and (suspected) exposure to covid-19: Secondary | ICD-10-CM | POA: Diagnosis not present

## 2020-04-20 DIAGNOSIS — Z20822 Contact with and (suspected) exposure to covid-19: Secondary | ICD-10-CM | POA: Diagnosis not present

## 2020-04-25 DIAGNOSIS — Z419 Encounter for procedure for purposes other than remedying health state, unspecified: Secondary | ICD-10-CM | POA: Diagnosis not present

## 2020-05-23 DIAGNOSIS — Z419 Encounter for procedure for purposes other than remedying health state, unspecified: Secondary | ICD-10-CM | POA: Diagnosis not present

## 2020-06-23 DIAGNOSIS — Z419 Encounter for procedure for purposes other than remedying health state, unspecified: Secondary | ICD-10-CM | POA: Diagnosis not present

## 2020-07-19 DIAGNOSIS — S299XXA Unspecified injury of thorax, initial encounter: Secondary | ICD-10-CM | POA: Diagnosis not present

## 2020-07-19 DIAGNOSIS — R4020 Unspecified coma: Secondary | ICD-10-CM | POA: Diagnosis not present

## 2020-07-19 DIAGNOSIS — S3993XA Unspecified injury of pelvis, initial encounter: Secondary | ICD-10-CM | POA: Diagnosis not present

## 2020-07-19 DIAGNOSIS — S8991XA Unspecified injury of right lower leg, initial encounter: Secondary | ICD-10-CM | POA: Diagnosis not present

## 2020-07-19 DIAGNOSIS — S3991XA Unspecified injury of abdomen, initial encounter: Secondary | ICD-10-CM | POA: Diagnosis not present

## 2020-07-19 DIAGNOSIS — M546 Pain in thoracic spine: Secondary | ICD-10-CM | POA: Diagnosis not present

## 2020-07-19 DIAGNOSIS — M542 Cervicalgia: Secondary | ICD-10-CM | POA: Diagnosis not present

## 2020-07-19 DIAGNOSIS — M545 Low back pain, unspecified: Secondary | ICD-10-CM | POA: Diagnosis not present

## 2020-07-19 DIAGNOSIS — R109 Unspecified abdominal pain: Secondary | ICD-10-CM | POA: Diagnosis not present

## 2020-07-19 DIAGNOSIS — R519 Headache, unspecified: Secondary | ICD-10-CM | POA: Diagnosis not present

## 2020-07-19 DIAGNOSIS — R079 Chest pain, unspecified: Secondary | ICD-10-CM | POA: Diagnosis not present

## 2020-07-19 DIAGNOSIS — S59902A Unspecified injury of left elbow, initial encounter: Secondary | ICD-10-CM | POA: Diagnosis not present

## 2020-07-23 DIAGNOSIS — Z419 Encounter for procedure for purposes other than remedying health state, unspecified: Secondary | ICD-10-CM | POA: Diagnosis not present

## 2020-08-23 DIAGNOSIS — Z419 Encounter for procedure for purposes other than remedying health state, unspecified: Secondary | ICD-10-CM | POA: Diagnosis not present

## 2020-09-22 DIAGNOSIS — Z419 Encounter for procedure for purposes other than remedying health state, unspecified: Secondary | ICD-10-CM | POA: Diagnosis not present

## 2020-09-30 DIAGNOSIS — L739 Follicular disorder, unspecified: Secondary | ICD-10-CM | POA: Diagnosis not present

## 2020-10-23 DIAGNOSIS — Z419 Encounter for procedure for purposes other than remedying health state, unspecified: Secondary | ICD-10-CM | POA: Diagnosis not present

## 2020-11-23 DIAGNOSIS — Z419 Encounter for procedure for purposes other than remedying health state, unspecified: Secondary | ICD-10-CM | POA: Diagnosis not present

## 2020-12-23 DIAGNOSIS — Z419 Encounter for procedure for purposes other than remedying health state, unspecified: Secondary | ICD-10-CM | POA: Diagnosis not present

## 2021-01-23 DIAGNOSIS — Z419 Encounter for procedure for purposes other than remedying health state, unspecified: Secondary | ICD-10-CM | POA: Diagnosis not present

## 2021-01-23 IMAGING — US US ART/VEN ABD/PELV/SCROTUM DOPPLER LTD
2 series · 13 of 25 positions shown · non-contrast
Comparison: None.

CLINICAL DATA: Right lower quadrant abdominal pain. No time course
given.



[Series 1: us art/ven abd/pelv/scrotum doppler ltd · 3 of 12 slices shown (1 of 2)]
[im 1/12]
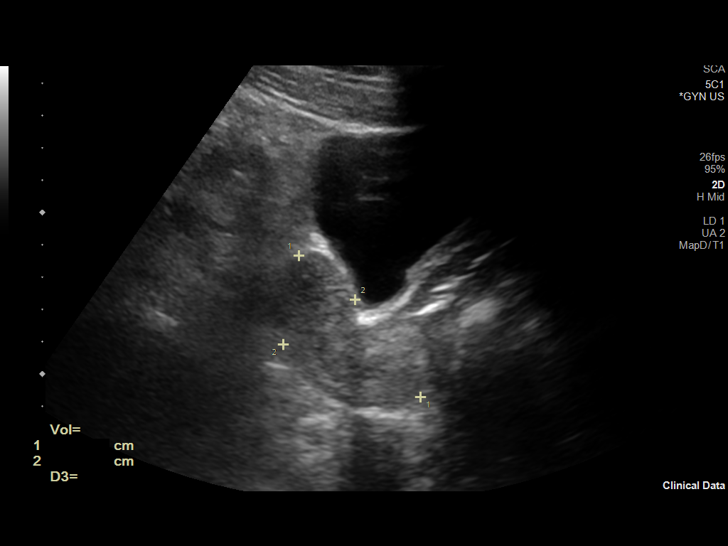
[im 5/12]
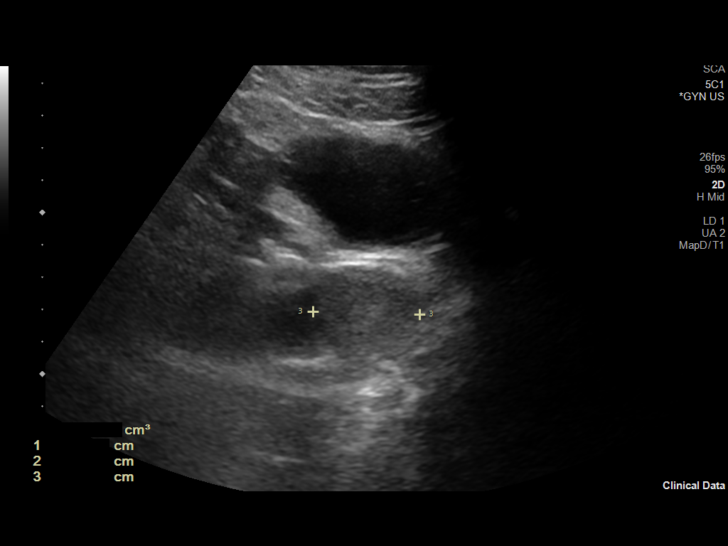
[im 9/12]
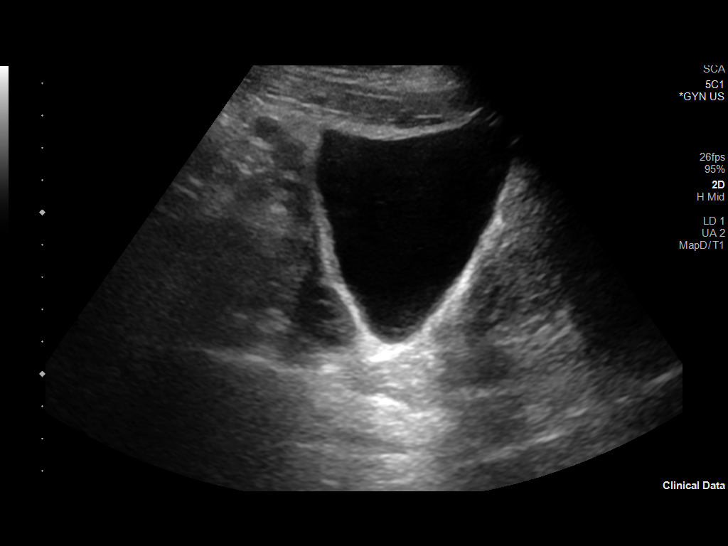

[Series 2: us art/ven abd/pelv/scrotum doppler ltd · 38 acquisitions, 10 frames shown (2 of 2)]
[im 1/38]
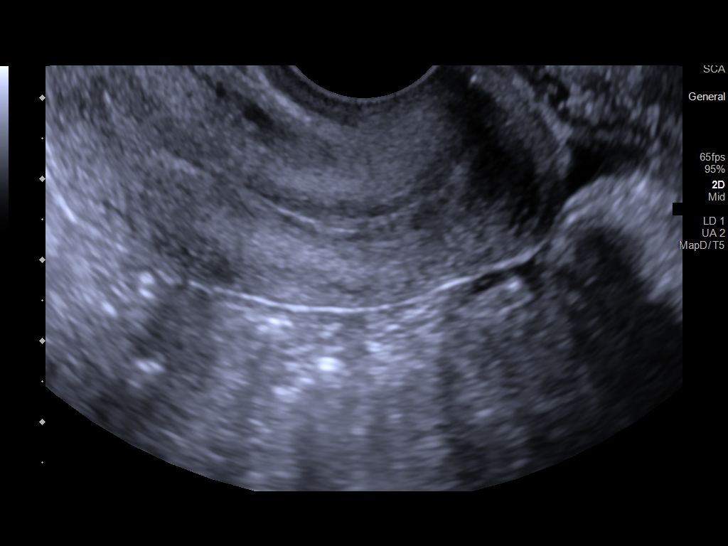
[im 5/38]
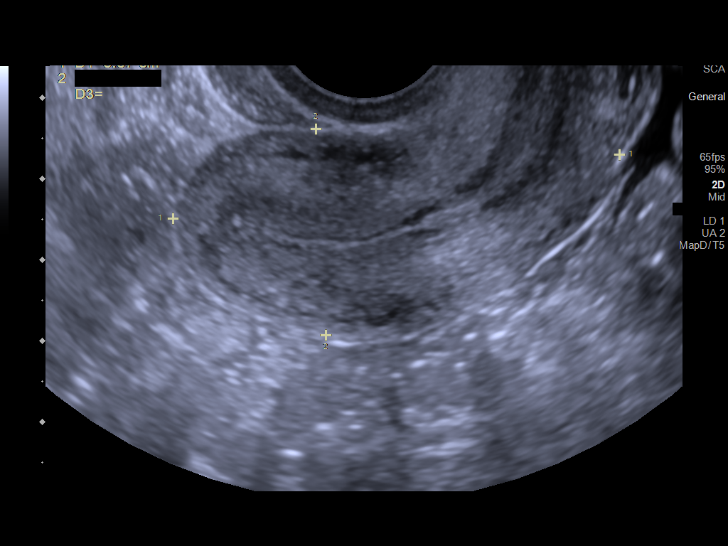
[im 9/38]
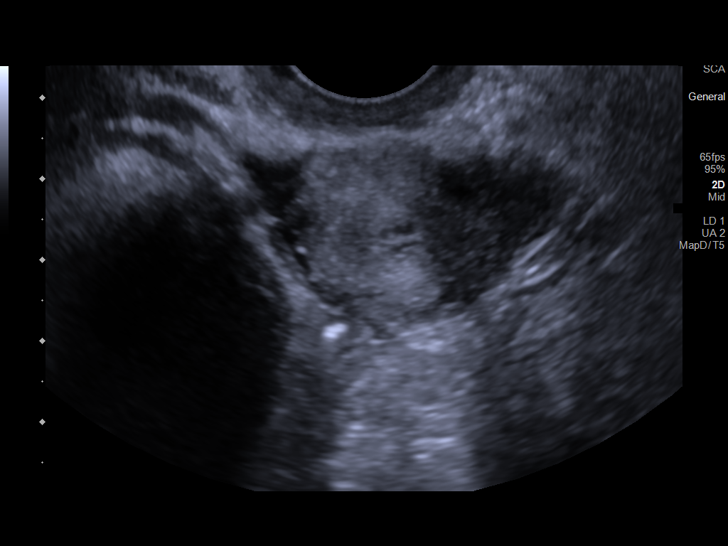
[im 13/38]
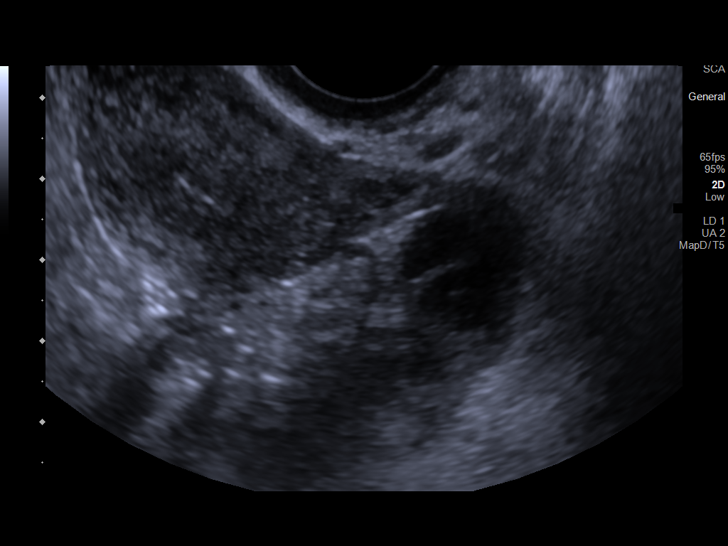
[im 17/38]
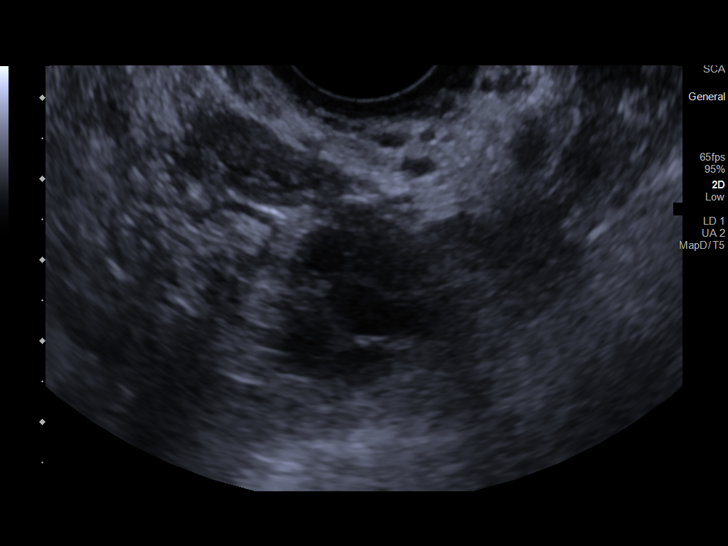
[im 21/38]
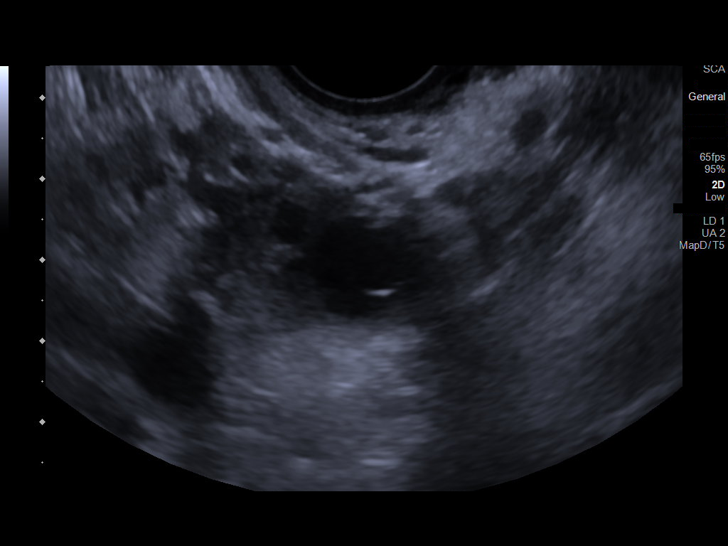
[im 25/38]
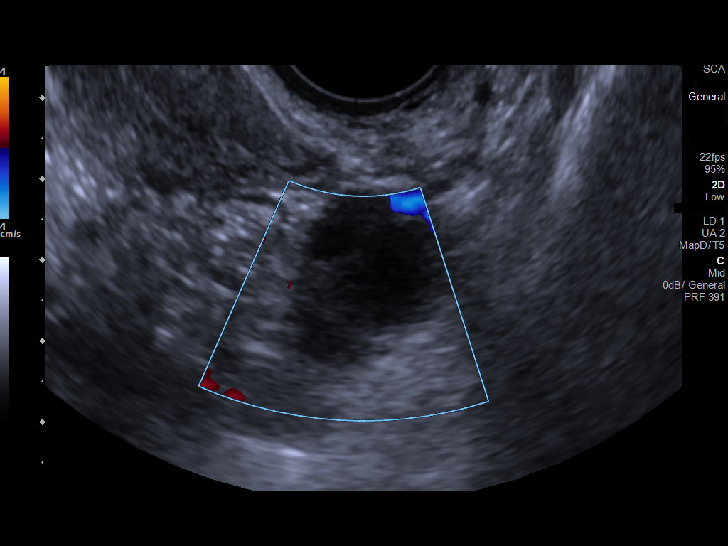
[im 29/38]
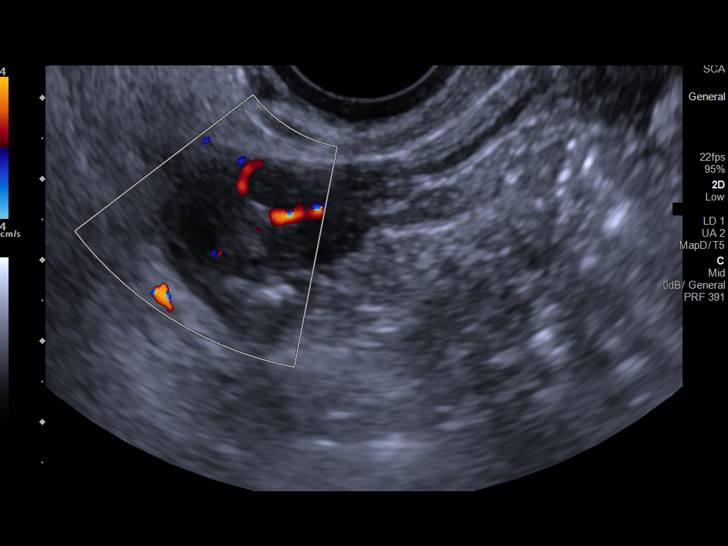
[im 33/38]
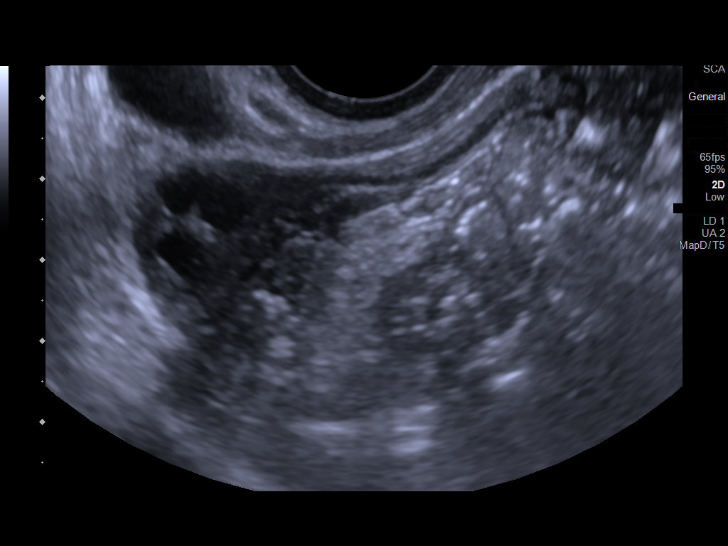
[im 38/38]
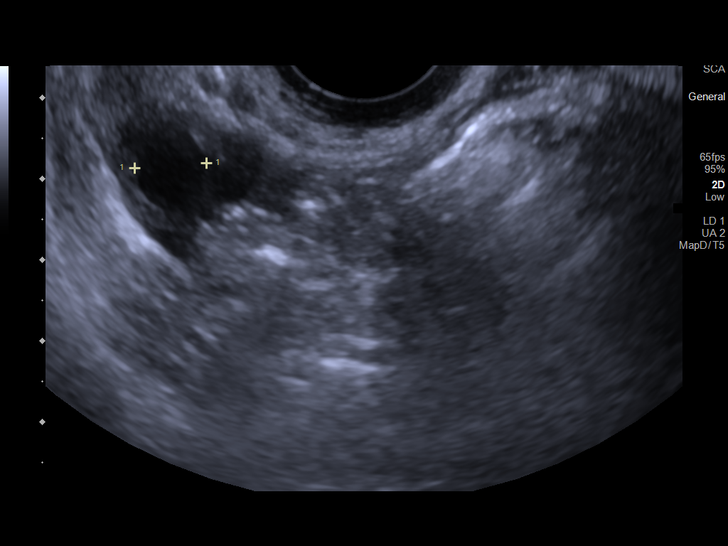

[13 of 25 positions shown; findings below may reference images not displayed]

FINDINGS: Uterus

Measurements: 5.6 x 2.6 x 3.4 cm = volume: 25.6 mL. No myometrial
abnormalities are identified.

Endometrium

Thickness: 1.1 mm.  No focal abnormality visualized.

Right ovary

Measurements: 2.9 x 2.5 x 1.7 cm = volume: 6.4 mL. Multiple
follicles. No mass lesion.

Left ovary

Measurements: 2.2 x 1.7 x 1.8 cm = volume: 3.5 mL. Multiple
follicles. No mass lesion.

Pulsed Doppler evaluation of both ovaries demonstrates normal
low-resistance arterial and venous waveforms.

Other findings

Trace free pelvic fluid.
IMPRESSION: 1. Normal appearance of the uterus and ovaries.
2. Patent intra-ovarian blood flow bilaterally.

## 2021-02-22 DIAGNOSIS — Z419 Encounter for procedure for purposes other than remedying health state, unspecified: Secondary | ICD-10-CM | POA: Diagnosis not present

## 2021-03-25 DIAGNOSIS — Z419 Encounter for procedure for purposes other than remedying health state, unspecified: Secondary | ICD-10-CM | POA: Diagnosis not present

## 2021-03-30 DIAGNOSIS — F4312 Post-traumatic stress disorder, chronic: Secondary | ICD-10-CM | POA: Diagnosis not present

## 2021-03-30 DIAGNOSIS — F3132 Bipolar disorder, current episode depressed, moderate: Secondary | ICD-10-CM | POA: Diagnosis not present

## 2021-03-30 DIAGNOSIS — F411 Generalized anxiety disorder: Secondary | ICD-10-CM | POA: Diagnosis not present

## 2021-04-03 DIAGNOSIS — F3132 Bipolar disorder, current episode depressed, moderate: Secondary | ICD-10-CM | POA: Diagnosis not present

## 2021-04-03 DIAGNOSIS — F411 Generalized anxiety disorder: Secondary | ICD-10-CM | POA: Diagnosis not present

## 2021-04-03 DIAGNOSIS — F4312 Post-traumatic stress disorder, chronic: Secondary | ICD-10-CM | POA: Diagnosis not present

## 2021-04-12 DIAGNOSIS — F3132 Bipolar disorder, current episode depressed, moderate: Secondary | ICD-10-CM | POA: Diagnosis not present

## 2021-04-12 DIAGNOSIS — F4312 Post-traumatic stress disorder, chronic: Secondary | ICD-10-CM | POA: Diagnosis not present

## 2021-04-12 DIAGNOSIS — F411 Generalized anxiety disorder: Secondary | ICD-10-CM | POA: Diagnosis not present

## 2021-04-19 DIAGNOSIS — F411 Generalized anxiety disorder: Secondary | ICD-10-CM | POA: Diagnosis not present

## 2021-04-19 DIAGNOSIS — F3132 Bipolar disorder, current episode depressed, moderate: Secondary | ICD-10-CM | POA: Diagnosis not present

## 2021-04-19 DIAGNOSIS — F4312 Post-traumatic stress disorder, chronic: Secondary | ICD-10-CM | POA: Diagnosis not present

## 2021-04-25 DIAGNOSIS — Z419 Encounter for procedure for purposes other than remedying health state, unspecified: Secondary | ICD-10-CM | POA: Diagnosis not present

## 2021-04-26 DIAGNOSIS — F4312 Post-traumatic stress disorder, chronic: Secondary | ICD-10-CM | POA: Diagnosis not present

## 2021-04-26 DIAGNOSIS — F411 Generalized anxiety disorder: Secondary | ICD-10-CM | POA: Diagnosis not present

## 2021-04-26 DIAGNOSIS — F3132 Bipolar disorder, current episode depressed, moderate: Secondary | ICD-10-CM | POA: Diagnosis not present

## 2021-05-04 DIAGNOSIS — F3132 Bipolar disorder, current episode depressed, moderate: Secondary | ICD-10-CM | POA: Diagnosis not present

## 2021-05-04 DIAGNOSIS — F4312 Post-traumatic stress disorder, chronic: Secondary | ICD-10-CM | POA: Diagnosis not present

## 2021-05-04 DIAGNOSIS — F411 Generalized anxiety disorder: Secondary | ICD-10-CM | POA: Diagnosis not present

## 2021-05-10 DIAGNOSIS — F3132 Bipolar disorder, current episode depressed, moderate: Secondary | ICD-10-CM | POA: Diagnosis not present

## 2021-05-10 DIAGNOSIS — F411 Generalized anxiety disorder: Secondary | ICD-10-CM | POA: Diagnosis not present

## 2021-05-10 DIAGNOSIS — F4312 Post-traumatic stress disorder, chronic: Secondary | ICD-10-CM | POA: Diagnosis not present

## 2021-05-17 DIAGNOSIS — F4312 Post-traumatic stress disorder, chronic: Secondary | ICD-10-CM | POA: Diagnosis not present

## 2021-05-17 DIAGNOSIS — F411 Generalized anxiety disorder: Secondary | ICD-10-CM | POA: Diagnosis not present

## 2021-05-17 DIAGNOSIS — F3132 Bipolar disorder, current episode depressed, moderate: Secondary | ICD-10-CM | POA: Diagnosis not present

## 2021-05-23 DIAGNOSIS — Z419 Encounter for procedure for purposes other than remedying health state, unspecified: Secondary | ICD-10-CM | POA: Diagnosis not present

## 2021-05-24 DIAGNOSIS — F4312 Post-traumatic stress disorder, chronic: Secondary | ICD-10-CM | POA: Diagnosis not present

## 2021-05-24 DIAGNOSIS — F3132 Bipolar disorder, current episode depressed, moderate: Secondary | ICD-10-CM | POA: Diagnosis not present

## 2021-05-24 DIAGNOSIS — F411 Generalized anxiety disorder: Secondary | ICD-10-CM | POA: Diagnosis not present

## 2021-06-07 DIAGNOSIS — F411 Generalized anxiety disorder: Secondary | ICD-10-CM | POA: Diagnosis not present

## 2021-06-07 DIAGNOSIS — F3132 Bipolar disorder, current episode depressed, moderate: Secondary | ICD-10-CM | POA: Diagnosis not present

## 2021-06-07 DIAGNOSIS — F4312 Post-traumatic stress disorder, chronic: Secondary | ICD-10-CM | POA: Diagnosis not present

## 2021-06-14 DIAGNOSIS — F3132 Bipolar disorder, current episode depressed, moderate: Secondary | ICD-10-CM | POA: Diagnosis not present

## 2021-06-14 DIAGNOSIS — F411 Generalized anxiety disorder: Secondary | ICD-10-CM | POA: Diagnosis not present

## 2021-06-14 DIAGNOSIS — F4312 Post-traumatic stress disorder, chronic: Secondary | ICD-10-CM | POA: Diagnosis not present

## 2021-06-21 DIAGNOSIS — F3132 Bipolar disorder, current episode depressed, moderate: Secondary | ICD-10-CM | POA: Diagnosis not present

## 2021-06-21 DIAGNOSIS — F4312 Post-traumatic stress disorder, chronic: Secondary | ICD-10-CM | POA: Diagnosis not present

## 2021-06-21 DIAGNOSIS — F411 Generalized anxiety disorder: Secondary | ICD-10-CM | POA: Diagnosis not present

## 2021-06-23 DIAGNOSIS — Z419 Encounter for procedure for purposes other than remedying health state, unspecified: Secondary | ICD-10-CM | POA: Diagnosis not present

## 2021-06-28 DIAGNOSIS — F4312 Post-traumatic stress disorder, chronic: Secondary | ICD-10-CM | POA: Diagnosis not present

## 2021-06-28 DIAGNOSIS — F3132 Bipolar disorder, current episode depressed, moderate: Secondary | ICD-10-CM | POA: Diagnosis not present

## 2021-06-28 DIAGNOSIS — F411 Generalized anxiety disorder: Secondary | ICD-10-CM | POA: Diagnosis not present

## 2021-07-05 DIAGNOSIS — F4312 Post-traumatic stress disorder, chronic: Secondary | ICD-10-CM | POA: Diagnosis not present

## 2021-07-05 DIAGNOSIS — F411 Generalized anxiety disorder: Secondary | ICD-10-CM | POA: Diagnosis not present

## 2021-07-05 DIAGNOSIS — F3132 Bipolar disorder, current episode depressed, moderate: Secondary | ICD-10-CM | POA: Diagnosis not present

## 2021-07-13 DIAGNOSIS — F4312 Post-traumatic stress disorder, chronic: Secondary | ICD-10-CM | POA: Diagnosis not present

## 2021-07-13 DIAGNOSIS — F411 Generalized anxiety disorder: Secondary | ICD-10-CM | POA: Diagnosis not present

## 2021-07-13 DIAGNOSIS — F3132 Bipolar disorder, current episode depressed, moderate: Secondary | ICD-10-CM | POA: Diagnosis not present

## 2021-07-23 DIAGNOSIS — Z419 Encounter for procedure for purposes other than remedying health state, unspecified: Secondary | ICD-10-CM | POA: Diagnosis not present

## 2021-07-26 DIAGNOSIS — F411 Generalized anxiety disorder: Secondary | ICD-10-CM | POA: Diagnosis not present

## 2021-07-26 DIAGNOSIS — F3132 Bipolar disorder, current episode depressed, moderate: Secondary | ICD-10-CM | POA: Diagnosis not present

## 2021-07-26 DIAGNOSIS — F4312 Post-traumatic stress disorder, chronic: Secondary | ICD-10-CM | POA: Diagnosis not present

## 2021-07-31 DIAGNOSIS — F411 Generalized anxiety disorder: Secondary | ICD-10-CM | POA: Diagnosis not present

## 2021-07-31 DIAGNOSIS — F410 Panic disorder [episodic paroxysmal anxiety] without agoraphobia: Secondary | ICD-10-CM | POA: Diagnosis not present

## 2021-07-31 DIAGNOSIS — F3181 Bipolar II disorder: Secondary | ICD-10-CM | POA: Diagnosis not present

## 2021-07-31 DIAGNOSIS — F4312 Post-traumatic stress disorder, chronic: Secondary | ICD-10-CM | POA: Diagnosis not present

## 2021-08-09 DIAGNOSIS — F411 Generalized anxiety disorder: Secondary | ICD-10-CM | POA: Diagnosis not present

## 2021-08-09 DIAGNOSIS — F4312 Post-traumatic stress disorder, chronic: Secondary | ICD-10-CM | POA: Diagnosis not present

## 2021-08-09 DIAGNOSIS — F3132 Bipolar disorder, current episode depressed, moderate: Secondary | ICD-10-CM | POA: Diagnosis not present

## 2021-08-21 DIAGNOSIS — F4312 Post-traumatic stress disorder, chronic: Secondary | ICD-10-CM | POA: Diagnosis not present

## 2021-08-21 DIAGNOSIS — F411 Generalized anxiety disorder: Secondary | ICD-10-CM | POA: Diagnosis not present

## 2021-08-21 DIAGNOSIS — F3132 Bipolar disorder, current episode depressed, moderate: Secondary | ICD-10-CM | POA: Diagnosis not present

## 2021-08-23 DIAGNOSIS — Z419 Encounter for procedure for purposes other than remedying health state, unspecified: Secondary | ICD-10-CM | POA: Diagnosis not present

## 2021-08-28 DIAGNOSIS — F410 Panic disorder [episodic paroxysmal anxiety] without agoraphobia: Secondary | ICD-10-CM | POA: Diagnosis not present

## 2021-08-28 DIAGNOSIS — F4312 Post-traumatic stress disorder, chronic: Secondary | ICD-10-CM | POA: Diagnosis not present

## 2021-08-28 DIAGNOSIS — F411 Generalized anxiety disorder: Secondary | ICD-10-CM | POA: Diagnosis not present

## 2021-08-28 DIAGNOSIS — F3181 Bipolar II disorder: Secondary | ICD-10-CM | POA: Diagnosis not present

## 2021-09-11 DIAGNOSIS — F411 Generalized anxiety disorder: Secondary | ICD-10-CM | POA: Diagnosis not present

## 2021-09-11 DIAGNOSIS — F3132 Bipolar disorder, current episode depressed, moderate: Secondary | ICD-10-CM | POA: Diagnosis not present

## 2021-09-11 DIAGNOSIS — F4312 Post-traumatic stress disorder, chronic: Secondary | ICD-10-CM | POA: Diagnosis not present

## 2021-09-20 DIAGNOSIS — F3132 Bipolar disorder, current episode depressed, moderate: Secondary | ICD-10-CM | POA: Diagnosis not present

## 2021-09-20 DIAGNOSIS — F4312 Post-traumatic stress disorder, chronic: Secondary | ICD-10-CM | POA: Diagnosis not present

## 2021-09-20 DIAGNOSIS — F411 Generalized anxiety disorder: Secondary | ICD-10-CM | POA: Diagnosis not present

## 2021-09-22 DIAGNOSIS — Z419 Encounter for procedure for purposes other than remedying health state, unspecified: Secondary | ICD-10-CM | POA: Diagnosis not present

## 2021-09-27 DIAGNOSIS — F410 Panic disorder [episodic paroxysmal anxiety] without agoraphobia: Secondary | ICD-10-CM | POA: Diagnosis not present

## 2021-09-27 DIAGNOSIS — F3181 Bipolar II disorder: Secondary | ICD-10-CM | POA: Diagnosis not present

## 2021-09-27 DIAGNOSIS — F4312 Post-traumatic stress disorder, chronic: Secondary | ICD-10-CM | POA: Diagnosis not present

## 2021-09-27 DIAGNOSIS — F411 Generalized anxiety disorder: Secondary | ICD-10-CM | POA: Diagnosis not present

## 2021-10-05 DIAGNOSIS — F4312 Post-traumatic stress disorder, chronic: Secondary | ICD-10-CM | POA: Diagnosis not present

## 2021-10-05 DIAGNOSIS — F3132 Bipolar disorder, current episode depressed, moderate: Secondary | ICD-10-CM | POA: Diagnosis not present

## 2021-10-05 DIAGNOSIS — F411 Generalized anxiety disorder: Secondary | ICD-10-CM | POA: Diagnosis not present

## 2021-10-11 DIAGNOSIS — F4312 Post-traumatic stress disorder, chronic: Secondary | ICD-10-CM | POA: Diagnosis not present

## 2021-10-11 DIAGNOSIS — F411 Generalized anxiety disorder: Secondary | ICD-10-CM | POA: Diagnosis not present

## 2021-10-11 DIAGNOSIS — F3132 Bipolar disorder, current episode depressed, moderate: Secondary | ICD-10-CM | POA: Diagnosis not present

## 2021-10-18 DIAGNOSIS — F411 Generalized anxiety disorder: Secondary | ICD-10-CM | POA: Diagnosis not present

## 2021-10-18 DIAGNOSIS — F410 Panic disorder [episodic paroxysmal anxiety] without agoraphobia: Secondary | ICD-10-CM | POA: Diagnosis not present

## 2021-10-18 DIAGNOSIS — F4312 Post-traumatic stress disorder, chronic: Secondary | ICD-10-CM | POA: Diagnosis not present

## 2021-10-18 DIAGNOSIS — F3181 Bipolar II disorder: Secondary | ICD-10-CM | POA: Diagnosis not present

## 2021-10-23 DIAGNOSIS — F4312 Post-traumatic stress disorder, chronic: Secondary | ICD-10-CM | POA: Diagnosis not present

## 2021-10-23 DIAGNOSIS — Z419 Encounter for procedure for purposes other than remedying health state, unspecified: Secondary | ICD-10-CM | POA: Diagnosis not present

## 2021-10-23 DIAGNOSIS — F3132 Bipolar disorder, current episode depressed, moderate: Secondary | ICD-10-CM | POA: Diagnosis not present

## 2021-10-23 DIAGNOSIS — F411 Generalized anxiety disorder: Secondary | ICD-10-CM | POA: Diagnosis not present

## 2021-11-01 DIAGNOSIS — F4312 Post-traumatic stress disorder, chronic: Secondary | ICD-10-CM | POA: Diagnosis not present

## 2021-11-01 DIAGNOSIS — F411 Generalized anxiety disorder: Secondary | ICD-10-CM | POA: Diagnosis not present

## 2021-11-01 DIAGNOSIS — F3132 Bipolar disorder, current episode depressed, moderate: Secondary | ICD-10-CM | POA: Diagnosis not present

## 2021-11-08 DIAGNOSIS — F411 Generalized anxiety disorder: Secondary | ICD-10-CM | POA: Diagnosis not present

## 2021-11-08 DIAGNOSIS — F3132 Bipolar disorder, current episode depressed, moderate: Secondary | ICD-10-CM | POA: Diagnosis not present

## 2021-11-08 DIAGNOSIS — F4312 Post-traumatic stress disorder, chronic: Secondary | ICD-10-CM | POA: Diagnosis not present

## 2021-11-12 DIAGNOSIS — F4312 Post-traumatic stress disorder, chronic: Secondary | ICD-10-CM | POA: Diagnosis not present

## 2021-11-12 DIAGNOSIS — F3181 Bipolar II disorder: Secondary | ICD-10-CM | POA: Diagnosis not present

## 2021-11-12 DIAGNOSIS — F411 Generalized anxiety disorder: Secondary | ICD-10-CM | POA: Diagnosis not present

## 2021-11-12 DIAGNOSIS — F410 Panic disorder [episodic paroxysmal anxiety] without agoraphobia: Secondary | ICD-10-CM | POA: Diagnosis not present

## 2021-11-22 DIAGNOSIS — F3132 Bipolar disorder, current episode depressed, moderate: Secondary | ICD-10-CM | POA: Diagnosis not present

## 2021-11-22 DIAGNOSIS — F4312 Post-traumatic stress disorder, chronic: Secondary | ICD-10-CM | POA: Diagnosis not present

## 2021-11-22 DIAGNOSIS — F411 Generalized anxiety disorder: Secondary | ICD-10-CM | POA: Diagnosis not present

## 2021-11-23 DIAGNOSIS — Z419 Encounter for procedure for purposes other than remedying health state, unspecified: Secondary | ICD-10-CM | POA: Diagnosis not present

## 2021-11-29 DIAGNOSIS — F3132 Bipolar disorder, current episode depressed, moderate: Secondary | ICD-10-CM | POA: Diagnosis not present

## 2021-11-29 DIAGNOSIS — F411 Generalized anxiety disorder: Secondary | ICD-10-CM | POA: Diagnosis not present

## 2021-11-29 DIAGNOSIS — F4312 Post-traumatic stress disorder, chronic: Secondary | ICD-10-CM | POA: Diagnosis not present

## 2021-12-06 DIAGNOSIS — F4312 Post-traumatic stress disorder, chronic: Secondary | ICD-10-CM | POA: Diagnosis not present

## 2021-12-06 DIAGNOSIS — F3132 Bipolar disorder, current episode depressed, moderate: Secondary | ICD-10-CM | POA: Diagnosis not present

## 2021-12-06 DIAGNOSIS — F411 Generalized anxiety disorder: Secondary | ICD-10-CM | POA: Diagnosis not present

## 2021-12-10 DIAGNOSIS — F4312 Post-traumatic stress disorder, chronic: Secondary | ICD-10-CM | POA: Diagnosis not present

## 2021-12-10 DIAGNOSIS — F410 Panic disorder [episodic paroxysmal anxiety] without agoraphobia: Secondary | ICD-10-CM | POA: Diagnosis not present

## 2021-12-10 DIAGNOSIS — F411 Generalized anxiety disorder: Secondary | ICD-10-CM | POA: Diagnosis not present

## 2021-12-10 DIAGNOSIS — F3181 Bipolar II disorder: Secondary | ICD-10-CM | POA: Diagnosis not present

## 2021-12-18 DIAGNOSIS — F411 Generalized anxiety disorder: Secondary | ICD-10-CM | POA: Diagnosis not present

## 2021-12-18 DIAGNOSIS — F3132 Bipolar disorder, current episode depressed, moderate: Secondary | ICD-10-CM | POA: Diagnosis not present

## 2021-12-18 DIAGNOSIS — F4312 Post-traumatic stress disorder, chronic: Secondary | ICD-10-CM | POA: Diagnosis not present

## 2021-12-23 DIAGNOSIS — Z419 Encounter for procedure for purposes other than remedying health state, unspecified: Secondary | ICD-10-CM | POA: Diagnosis not present

## 2021-12-25 DIAGNOSIS — F3132 Bipolar disorder, current episode depressed, moderate: Secondary | ICD-10-CM | POA: Diagnosis not present

## 2021-12-25 DIAGNOSIS — F411 Generalized anxiety disorder: Secondary | ICD-10-CM | POA: Diagnosis not present

## 2021-12-25 DIAGNOSIS — F4312 Post-traumatic stress disorder, chronic: Secondary | ICD-10-CM | POA: Diagnosis not present

## 2022-01-07 DIAGNOSIS — F410 Panic disorder [episodic paroxysmal anxiety] without agoraphobia: Secondary | ICD-10-CM | POA: Diagnosis not present

## 2022-01-07 DIAGNOSIS — F411 Generalized anxiety disorder: Secondary | ICD-10-CM | POA: Diagnosis not present

## 2022-01-07 DIAGNOSIS — F4312 Post-traumatic stress disorder, chronic: Secondary | ICD-10-CM | POA: Diagnosis not present

## 2022-01-07 DIAGNOSIS — F3181 Bipolar II disorder: Secondary | ICD-10-CM | POA: Diagnosis not present

## 2022-01-08 DIAGNOSIS — F411 Generalized anxiety disorder: Secondary | ICD-10-CM | POA: Diagnosis not present

## 2022-01-08 DIAGNOSIS — F3132 Bipolar disorder, current episode depressed, moderate: Secondary | ICD-10-CM | POA: Diagnosis not present

## 2022-01-08 DIAGNOSIS — F4312 Post-traumatic stress disorder, chronic: Secondary | ICD-10-CM | POA: Diagnosis not present

## 2022-01-15 DIAGNOSIS — F411 Generalized anxiety disorder: Secondary | ICD-10-CM | POA: Diagnosis not present

## 2022-01-15 DIAGNOSIS — F4312 Post-traumatic stress disorder, chronic: Secondary | ICD-10-CM | POA: Diagnosis not present

## 2022-01-15 DIAGNOSIS — F3132 Bipolar disorder, current episode depressed, moderate: Secondary | ICD-10-CM | POA: Diagnosis not present

## 2022-01-17 DIAGNOSIS — R112 Nausea with vomiting, unspecified: Secondary | ICD-10-CM | POA: Diagnosis not present

## 2022-01-17 DIAGNOSIS — R5383 Other fatigue: Secondary | ICD-10-CM | POA: Diagnosis not present

## 2022-01-17 DIAGNOSIS — K219 Gastro-esophageal reflux disease without esophagitis: Secondary | ICD-10-CM | POA: Diagnosis not present

## 2022-01-17 DIAGNOSIS — E559 Vitamin D deficiency, unspecified: Secondary | ICD-10-CM | POA: Diagnosis not present

## 2022-01-17 DIAGNOSIS — Z23 Encounter for immunization: Secondary | ICD-10-CM | POA: Diagnosis not present

## 2022-01-23 DIAGNOSIS — Z419 Encounter for procedure for purposes other than remedying health state, unspecified: Secondary | ICD-10-CM | POA: Diagnosis not present

## 2022-01-24 DIAGNOSIS — F4312 Post-traumatic stress disorder, chronic: Secondary | ICD-10-CM | POA: Diagnosis not present

## 2022-01-24 DIAGNOSIS — F411 Generalized anxiety disorder: Secondary | ICD-10-CM | POA: Diagnosis not present

## 2022-01-24 DIAGNOSIS — F3132 Bipolar disorder, current episode depressed, moderate: Secondary | ICD-10-CM | POA: Diagnosis not present

## 2022-01-31 DIAGNOSIS — F3132 Bipolar disorder, current episode depressed, moderate: Secondary | ICD-10-CM | POA: Diagnosis not present

## 2022-01-31 DIAGNOSIS — F411 Generalized anxiety disorder: Secondary | ICD-10-CM | POA: Diagnosis not present

## 2022-01-31 DIAGNOSIS — F4312 Post-traumatic stress disorder, chronic: Secondary | ICD-10-CM | POA: Diagnosis not present

## 2022-02-05 DIAGNOSIS — R112 Nausea with vomiting, unspecified: Secondary | ICD-10-CM | POA: Diagnosis not present

## 2022-02-06 DIAGNOSIS — Z0000 Encounter for general adult medical examination without abnormal findings: Secondary | ICD-10-CM | POA: Diagnosis not present

## 2022-02-06 DIAGNOSIS — R5383 Other fatigue: Secondary | ICD-10-CM | POA: Diagnosis not present

## 2022-02-06 DIAGNOSIS — E538 Deficiency of other specified B group vitamins: Secondary | ICD-10-CM | POA: Diagnosis not present

## 2022-02-06 DIAGNOSIS — Z3200 Encounter for pregnancy test, result unknown: Secondary | ICD-10-CM | POA: Diagnosis not present

## 2022-02-06 DIAGNOSIS — F17210 Nicotine dependence, cigarettes, uncomplicated: Secondary | ICD-10-CM | POA: Diagnosis not present

## 2022-02-06 DIAGNOSIS — R0602 Shortness of breath: Secondary | ICD-10-CM | POA: Diagnosis not present

## 2022-02-07 DIAGNOSIS — F4312 Post-traumatic stress disorder, chronic: Secondary | ICD-10-CM | POA: Diagnosis not present

## 2022-02-07 DIAGNOSIS — F3132 Bipolar disorder, current episode depressed, moderate: Secondary | ICD-10-CM | POA: Diagnosis not present

## 2022-02-07 DIAGNOSIS — F411 Generalized anxiety disorder: Secondary | ICD-10-CM | POA: Diagnosis not present

## 2022-02-18 DIAGNOSIS — F3181 Bipolar II disorder: Secondary | ICD-10-CM | POA: Diagnosis not present

## 2022-02-18 DIAGNOSIS — F410 Panic disorder [episodic paroxysmal anxiety] without agoraphobia: Secondary | ICD-10-CM | POA: Diagnosis not present

## 2022-02-18 DIAGNOSIS — F4312 Post-traumatic stress disorder, chronic: Secondary | ICD-10-CM | POA: Diagnosis not present

## 2022-02-18 DIAGNOSIS — F411 Generalized anxiety disorder: Secondary | ICD-10-CM | POA: Diagnosis not present

## 2022-02-20 DIAGNOSIS — K219 Gastro-esophageal reflux disease without esophagitis: Secondary | ICD-10-CM | POA: Diagnosis not present

## 2022-02-20 DIAGNOSIS — R1011 Right upper quadrant pain: Secondary | ICD-10-CM | POA: Diagnosis not present

## 2022-02-20 DIAGNOSIS — K589 Irritable bowel syndrome without diarrhea: Secondary | ICD-10-CM | POA: Diagnosis not present

## 2022-02-20 DIAGNOSIS — R110 Nausea: Secondary | ICD-10-CM | POA: Diagnosis not present

## 2022-02-21 DIAGNOSIS — F3132 Bipolar disorder, current episode depressed, moderate: Secondary | ICD-10-CM | POA: Diagnosis not present

## 2022-02-21 DIAGNOSIS — F4312 Post-traumatic stress disorder, chronic: Secondary | ICD-10-CM | POA: Diagnosis not present

## 2022-02-21 DIAGNOSIS — F411 Generalized anxiety disorder: Secondary | ICD-10-CM | POA: Diagnosis not present

## 2022-02-22 DIAGNOSIS — Z419 Encounter for procedure for purposes other than remedying health state, unspecified: Secondary | ICD-10-CM | POA: Diagnosis not present

## 2022-03-12 DIAGNOSIS — Z1211 Encounter for screening for malignant neoplasm of colon: Secondary | ICD-10-CM | POA: Diagnosis not present

## 2022-03-12 DIAGNOSIS — R110 Nausea: Secondary | ICD-10-CM | POA: Diagnosis not present

## 2022-03-13 DIAGNOSIS — F3132 Bipolar disorder, current episode depressed, moderate: Secondary | ICD-10-CM | POA: Diagnosis not present

## 2022-03-13 DIAGNOSIS — F411 Generalized anxiety disorder: Secondary | ICD-10-CM | POA: Diagnosis not present

## 2022-03-13 DIAGNOSIS — F4312 Post-traumatic stress disorder, chronic: Secondary | ICD-10-CM | POA: Diagnosis not present

## 2022-03-15 DIAGNOSIS — Z3202 Encounter for pregnancy test, result negative: Secondary | ICD-10-CM | POA: Diagnosis not present

## 2022-03-15 DIAGNOSIS — R109 Unspecified abdominal pain: Secondary | ICD-10-CM | POA: Diagnosis not present

## 2022-03-15 DIAGNOSIS — R350 Frequency of micturition: Secondary | ICD-10-CM | POA: Diagnosis not present

## 2022-03-15 DIAGNOSIS — R112 Nausea with vomiting, unspecified: Secondary | ICD-10-CM | POA: Diagnosis not present

## 2022-03-15 DIAGNOSIS — N390 Urinary tract infection, site not specified: Secondary | ICD-10-CM | POA: Diagnosis not present

## 2022-03-23 DIAGNOSIS — K200 Eosinophilic esophagitis: Secondary | ICD-10-CM | POA: Diagnosis not present

## 2022-03-25 DIAGNOSIS — Z419 Encounter for procedure for purposes other than remedying health state, unspecified: Secondary | ICD-10-CM | POA: Diagnosis not present

## 2022-03-27 DIAGNOSIS — F411 Generalized anxiety disorder: Secondary | ICD-10-CM | POA: Diagnosis not present

## 2022-03-27 DIAGNOSIS — F3132 Bipolar disorder, current episode depressed, moderate: Secondary | ICD-10-CM | POA: Diagnosis not present

## 2022-03-27 DIAGNOSIS — F4312 Post-traumatic stress disorder, chronic: Secondary | ICD-10-CM | POA: Diagnosis not present

## 2022-04-03 DIAGNOSIS — F3132 Bipolar disorder, current episode depressed, moderate: Secondary | ICD-10-CM | POA: Diagnosis not present

## 2022-04-03 DIAGNOSIS — F411 Generalized anxiety disorder: Secondary | ICD-10-CM | POA: Diagnosis not present

## 2022-04-03 DIAGNOSIS — F4312 Post-traumatic stress disorder, chronic: Secondary | ICD-10-CM | POA: Diagnosis not present

## 2022-04-15 DIAGNOSIS — F411 Generalized anxiety disorder: Secondary | ICD-10-CM | POA: Diagnosis not present

## 2022-04-15 DIAGNOSIS — F4312 Post-traumatic stress disorder, chronic: Secondary | ICD-10-CM | POA: Diagnosis not present

## 2022-04-15 DIAGNOSIS — F410 Panic disorder [episodic paroxysmal anxiety] without agoraphobia: Secondary | ICD-10-CM | POA: Diagnosis not present

## 2022-04-15 DIAGNOSIS — F3181 Bipolar II disorder: Secondary | ICD-10-CM | POA: Diagnosis not present

## 2022-04-17 DIAGNOSIS — F3132 Bipolar disorder, current episode depressed, moderate: Secondary | ICD-10-CM | POA: Diagnosis not present

## 2022-04-17 DIAGNOSIS — F411 Generalized anxiety disorder: Secondary | ICD-10-CM | POA: Diagnosis not present

## 2022-04-17 DIAGNOSIS — F4312 Post-traumatic stress disorder, chronic: Secondary | ICD-10-CM | POA: Diagnosis not present

## 2022-04-18 DIAGNOSIS — Z975 Presence of (intrauterine) contraceptive device: Secondary | ICD-10-CM | POA: Diagnosis not present

## 2022-04-18 DIAGNOSIS — Z0000 Encounter for general adult medical examination without abnormal findings: Secondary | ICD-10-CM | POA: Diagnosis not present

## 2022-04-18 DIAGNOSIS — Z01419 Encounter for gynecological examination (general) (routine) without abnormal findings: Secondary | ICD-10-CM | POA: Diagnosis not present

## 2022-04-24 DIAGNOSIS — F4312 Post-traumatic stress disorder, chronic: Secondary | ICD-10-CM | POA: Diagnosis not present

## 2022-04-24 DIAGNOSIS — F411 Generalized anxiety disorder: Secondary | ICD-10-CM | POA: Diagnosis not present

## 2022-04-24 DIAGNOSIS — F3132 Bipolar disorder, current episode depressed, moderate: Secondary | ICD-10-CM | POA: Diagnosis not present

## 2022-04-25 DIAGNOSIS — Z419 Encounter for procedure for purposes other than remedying health state, unspecified: Secondary | ICD-10-CM | POA: Diagnosis not present

## 2022-05-01 DIAGNOSIS — F4312 Post-traumatic stress disorder, chronic: Secondary | ICD-10-CM | POA: Diagnosis not present

## 2022-05-01 DIAGNOSIS — F3132 Bipolar disorder, current episode depressed, moderate: Secondary | ICD-10-CM | POA: Diagnosis not present

## 2022-05-01 DIAGNOSIS — F411 Generalized anxiety disorder: Secondary | ICD-10-CM | POA: Diagnosis not present

## 2022-05-08 DIAGNOSIS — F411 Generalized anxiety disorder: Secondary | ICD-10-CM | POA: Diagnosis not present

## 2022-05-08 DIAGNOSIS — F4312 Post-traumatic stress disorder, chronic: Secondary | ICD-10-CM | POA: Diagnosis not present

## 2022-05-08 DIAGNOSIS — F3132 Bipolar disorder, current episode depressed, moderate: Secondary | ICD-10-CM | POA: Diagnosis not present

## 2022-05-15 DIAGNOSIS — F4312 Post-traumatic stress disorder, chronic: Secondary | ICD-10-CM | POA: Diagnosis not present

## 2022-05-15 DIAGNOSIS — F3132 Bipolar disorder, current episode depressed, moderate: Secondary | ICD-10-CM | POA: Diagnosis not present

## 2022-05-15 DIAGNOSIS — F411 Generalized anxiety disorder: Secondary | ICD-10-CM | POA: Diagnosis not present

## 2022-05-20 DIAGNOSIS — Z3046 Encounter for surveillance of implantable subdermal contraceptive: Secondary | ICD-10-CM | POA: Diagnosis not present

## 2022-05-22 DIAGNOSIS — F411 Generalized anxiety disorder: Secondary | ICD-10-CM | POA: Diagnosis not present

## 2022-05-22 DIAGNOSIS — F3132 Bipolar disorder, current episode depressed, moderate: Secondary | ICD-10-CM | POA: Diagnosis not present

## 2022-05-22 DIAGNOSIS — F4312 Post-traumatic stress disorder, chronic: Secondary | ICD-10-CM | POA: Diagnosis not present

## 2022-05-24 DIAGNOSIS — Z419 Encounter for procedure for purposes other than remedying health state, unspecified: Secondary | ICD-10-CM | POA: Diagnosis not present

## 2022-05-25 DIAGNOSIS — F411 Generalized anxiety disorder: Secondary | ICD-10-CM | POA: Diagnosis not present

## 2022-05-25 DIAGNOSIS — H8190 Unspecified disorder of vestibular function, unspecified ear: Secondary | ICD-10-CM | POA: Diagnosis not present

## 2022-05-29 DIAGNOSIS — F4312 Post-traumatic stress disorder, chronic: Secondary | ICD-10-CM | POA: Diagnosis not present

## 2022-05-29 DIAGNOSIS — F411 Generalized anxiety disorder: Secondary | ICD-10-CM | POA: Diagnosis not present

## 2022-05-29 DIAGNOSIS — F3132 Bipolar disorder, current episode depressed, moderate: Secondary | ICD-10-CM | POA: Diagnosis not present

## 2022-06-05 DIAGNOSIS — F4312 Post-traumatic stress disorder, chronic: Secondary | ICD-10-CM | POA: Diagnosis not present

## 2022-06-05 DIAGNOSIS — F411 Generalized anxiety disorder: Secondary | ICD-10-CM | POA: Diagnosis not present

## 2022-06-05 DIAGNOSIS — F3132 Bipolar disorder, current episode depressed, moderate: Secondary | ICD-10-CM | POA: Diagnosis not present

## 2022-06-10 DIAGNOSIS — F410 Panic disorder [episodic paroxysmal anxiety] without agoraphobia: Secondary | ICD-10-CM | POA: Diagnosis not present

## 2022-06-10 DIAGNOSIS — F411 Generalized anxiety disorder: Secondary | ICD-10-CM | POA: Diagnosis not present

## 2022-06-10 DIAGNOSIS — F3181 Bipolar II disorder: Secondary | ICD-10-CM | POA: Diagnosis not present

## 2022-06-10 DIAGNOSIS — F4312 Post-traumatic stress disorder, chronic: Secondary | ICD-10-CM | POA: Diagnosis not present

## 2022-06-19 DIAGNOSIS — F411 Generalized anxiety disorder: Secondary | ICD-10-CM | POA: Diagnosis not present

## 2022-06-19 DIAGNOSIS — F4312 Post-traumatic stress disorder, chronic: Secondary | ICD-10-CM | POA: Diagnosis not present

## 2022-06-19 DIAGNOSIS — F3132 Bipolar disorder, current episode depressed, moderate: Secondary | ICD-10-CM | POA: Diagnosis not present

## 2022-06-24 DIAGNOSIS — Z419 Encounter for procedure for purposes other than remedying health state, unspecified: Secondary | ICD-10-CM | POA: Diagnosis not present

## 2022-06-26 DIAGNOSIS — F4312 Post-traumatic stress disorder, chronic: Secondary | ICD-10-CM | POA: Diagnosis not present

## 2022-06-26 DIAGNOSIS — F3132 Bipolar disorder, current episode depressed, moderate: Secondary | ICD-10-CM | POA: Diagnosis not present

## 2022-06-26 DIAGNOSIS — F411 Generalized anxiety disorder: Secondary | ICD-10-CM | POA: Diagnosis not present

## 2022-07-02 DIAGNOSIS — F411 Generalized anxiety disorder: Secondary | ICD-10-CM | POA: Diagnosis not present

## 2022-07-02 DIAGNOSIS — F4312 Post-traumatic stress disorder, chronic: Secondary | ICD-10-CM | POA: Diagnosis not present

## 2022-07-02 DIAGNOSIS — F410 Panic disorder [episodic paroxysmal anxiety] without agoraphobia: Secondary | ICD-10-CM | POA: Diagnosis not present

## 2022-07-02 DIAGNOSIS — F3181 Bipolar II disorder: Secondary | ICD-10-CM | POA: Diagnosis not present

## 2022-07-03 DIAGNOSIS — F3132 Bipolar disorder, current episode depressed, moderate: Secondary | ICD-10-CM | POA: Diagnosis not present

## 2022-07-03 DIAGNOSIS — F411 Generalized anxiety disorder: Secondary | ICD-10-CM | POA: Diagnosis not present

## 2022-07-03 DIAGNOSIS — F4312 Post-traumatic stress disorder, chronic: Secondary | ICD-10-CM | POA: Diagnosis not present

## 2022-07-10 DIAGNOSIS — F3132 Bipolar disorder, current episode depressed, moderate: Secondary | ICD-10-CM | POA: Diagnosis not present

## 2022-07-10 DIAGNOSIS — F411 Generalized anxiety disorder: Secondary | ICD-10-CM | POA: Diagnosis not present

## 2022-07-10 DIAGNOSIS — F4312 Post-traumatic stress disorder, chronic: Secondary | ICD-10-CM | POA: Diagnosis not present

## 2022-07-16 DIAGNOSIS — F3132 Bipolar disorder, current episode depressed, moderate: Secondary | ICD-10-CM | POA: Diagnosis not present

## 2022-07-16 DIAGNOSIS — F411 Generalized anxiety disorder: Secondary | ICD-10-CM | POA: Diagnosis not present

## 2022-07-16 DIAGNOSIS — F4312 Post-traumatic stress disorder, chronic: Secondary | ICD-10-CM | POA: Diagnosis not present

## 2022-07-23 DIAGNOSIS — F4312 Post-traumatic stress disorder, chronic: Secondary | ICD-10-CM | POA: Diagnosis not present

## 2022-07-23 DIAGNOSIS — F3132 Bipolar disorder, current episode depressed, moderate: Secondary | ICD-10-CM | POA: Diagnosis not present

## 2022-07-23 DIAGNOSIS — F411 Generalized anxiety disorder: Secondary | ICD-10-CM | POA: Diagnosis not present

## 2022-07-24 DIAGNOSIS — Z419 Encounter for procedure for purposes other than remedying health state, unspecified: Secondary | ICD-10-CM | POA: Diagnosis not present

## 2022-07-30 DIAGNOSIS — F411 Generalized anxiety disorder: Secondary | ICD-10-CM | POA: Diagnosis not present

## 2022-07-30 DIAGNOSIS — F410 Panic disorder [episodic paroxysmal anxiety] without agoraphobia: Secondary | ICD-10-CM | POA: Diagnosis not present

## 2022-07-30 DIAGNOSIS — F3181 Bipolar II disorder: Secondary | ICD-10-CM | POA: Diagnosis not present

## 2022-07-30 DIAGNOSIS — F4312 Post-traumatic stress disorder, chronic: Secondary | ICD-10-CM | POA: Diagnosis not present

## 2022-08-24 DIAGNOSIS — Z419 Encounter for procedure for purposes other than remedying health state, unspecified: Secondary | ICD-10-CM | POA: Diagnosis not present

## 2022-08-27 DIAGNOSIS — F411 Generalized anxiety disorder: Secondary | ICD-10-CM | POA: Diagnosis not present

## 2022-08-27 DIAGNOSIS — F4312 Post-traumatic stress disorder, chronic: Secondary | ICD-10-CM | POA: Diagnosis not present

## 2022-08-27 DIAGNOSIS — F3181 Bipolar II disorder: Secondary | ICD-10-CM | POA: Diagnosis not present

## 2022-08-27 DIAGNOSIS — F410 Panic disorder [episodic paroxysmal anxiety] without agoraphobia: Secondary | ICD-10-CM | POA: Diagnosis not present

## 2022-09-23 DIAGNOSIS — Z419 Encounter for procedure for purposes other than remedying health state, unspecified: Secondary | ICD-10-CM | POA: Diagnosis not present

## 2022-10-22 DIAGNOSIS — F3181 Bipolar II disorder: Secondary | ICD-10-CM | POA: Diagnosis not present

## 2022-10-22 DIAGNOSIS — F411 Generalized anxiety disorder: Secondary | ICD-10-CM | POA: Diagnosis not present

## 2022-10-22 DIAGNOSIS — F4312 Post-traumatic stress disorder, chronic: Secondary | ICD-10-CM | POA: Diagnosis not present

## 2022-10-22 DIAGNOSIS — F410 Panic disorder [episodic paroxysmal anxiety] without agoraphobia: Secondary | ICD-10-CM | POA: Diagnosis not present

## 2022-10-24 DIAGNOSIS — Z419 Encounter for procedure for purposes other than remedying health state, unspecified: Secondary | ICD-10-CM | POA: Diagnosis not present

## 2022-11-24 DIAGNOSIS — Z419 Encounter for procedure for purposes other than remedying health state, unspecified: Secondary | ICD-10-CM | POA: Diagnosis not present

## 2022-11-27 DIAGNOSIS — J309 Allergic rhinitis, unspecified: Secondary | ICD-10-CM | POA: Diagnosis not present

## 2022-12-24 DIAGNOSIS — F411 Generalized anxiety disorder: Secondary | ICD-10-CM | POA: Diagnosis not present

## 2022-12-24 DIAGNOSIS — F410 Panic disorder [episodic paroxysmal anxiety] without agoraphobia: Secondary | ICD-10-CM | POA: Diagnosis not present

## 2022-12-24 DIAGNOSIS — F3181 Bipolar II disorder: Secondary | ICD-10-CM | POA: Diagnosis not present

## 2022-12-24 DIAGNOSIS — F4312 Post-traumatic stress disorder, chronic: Secondary | ICD-10-CM | POA: Diagnosis not present

## 2022-12-24 DIAGNOSIS — Z419 Encounter for procedure for purposes other than remedying health state, unspecified: Secondary | ICD-10-CM | POA: Diagnosis not present

## 2023-01-14 DIAGNOSIS — Z713 Dietary counseling and surveillance: Secondary | ICD-10-CM | POA: Diagnosis not present

## 2023-01-16 ENCOUNTER — Ambulatory Visit (INDEPENDENT_AMBULATORY_CARE_PROVIDER_SITE_OTHER): Payer: BLUE CROSS/BLUE SHIELD | Admitting: Family Medicine

## 2023-01-16 ENCOUNTER — Encounter: Payer: Self-pay | Admitting: Family Medicine

## 2023-01-16 ENCOUNTER — Telehealth: Payer: Self-pay

## 2023-01-16 VITALS — BP 108/78 | HR 76 | Temp 98.4°F | Resp 18 | Ht 63.0 in | Wt 134.4 lb

## 2023-01-16 DIAGNOSIS — Z0001 Encounter for general adult medical examination with abnormal findings: Secondary | ICD-10-CM | POA: Diagnosis not present

## 2023-01-16 DIAGNOSIS — M21612 Bunion of left foot: Secondary | ICD-10-CM | POA: Diagnosis not present

## 2023-01-16 DIAGNOSIS — Z13220 Encounter for screening for lipoid disorders: Secondary | ICD-10-CM | POA: Diagnosis not present

## 2023-01-16 DIAGNOSIS — M21611 Bunion of right foot: Secondary | ICD-10-CM | POA: Diagnosis not present

## 2023-01-16 DIAGNOSIS — Z23 Encounter for immunization: Secondary | ICD-10-CM | POA: Diagnosis not present

## 2023-01-16 DIAGNOSIS — Z8619 Personal history of other infectious and parasitic diseases: Secondary | ICD-10-CM

## 2023-01-16 DIAGNOSIS — Z0000 Encounter for general adult medical examination without abnormal findings: Secondary | ICD-10-CM

## 2023-01-16 DIAGNOSIS — F418 Other specified anxiety disorders: Secondary | ICD-10-CM

## 2023-01-16 DIAGNOSIS — K219 Gastro-esophageal reflux disease without esophagitis: Secondary | ICD-10-CM | POA: Diagnosis not present

## 2023-01-16 DIAGNOSIS — F39 Unspecified mood [affective] disorder: Secondary | ICD-10-CM

## 2023-01-16 DIAGNOSIS — F50019 Anorexia nervosa, restricting type, unspecified: Secondary | ICD-10-CM | POA: Diagnosis not present

## 2023-01-16 MED ORDER — ONDANSETRON HCL 4 MG PO TABS: 4.0000 mg | ORAL_TABLET | Freq: Two times a day (BID) | ORAL | 5 refills | Status: AC | PRN

## 2023-01-16 MED ORDER — OMEPRAZOLE 20 MG PO CPDR: 20.0000 mg | DELAYED_RELEASE_CAPSULE | Freq: Every day | ORAL | 3 refills | Status: DC

## 2023-01-16 MED ORDER — VALACYCLOVIR HCL 1 G PO TABS: 1000.0000 mg | ORAL_TABLET | Freq: Two times a day (BID) | ORAL | 0 refills | Status: DC

## 2023-01-16 NOTE — Progress Notes (Signed)
Established Patient Office Visit  Subjective   Patient ID: Dana [name], female    DOB: 01-Nov-1995  Age: 27 y.o. MRN: [id number]  Chief Complaint  Patient presents with   New Patient (Initial Visit)    HPI Discussed the use of AI scribe software for clinical note transcription with the patient, who gave verbal consent to proceed.  History of Present Illness   The patient, with a history of mood disorder, depression, anxiety, and suicidal ideation, presents for a general check-up after a long gap due to insurance issues. She reports not feeling well overall and has been having problems with eating and maintaining energy levels. She has been seeing a therapist and a nutritionist for these issues. The patient also mentions having a mood disorder and has been hospitalized a couple of times for suicidal ideation. She is currently on multiple medications including hydroxyzine, Lamictal, Paxil, and Minipress.  The patient also reports having trouble keeping her medication down due to constant nausea, for which she has been prescribed omeprazole. She also mentions having cold sores frequently and has been taking L-lysine vitamins to manage them. She has been prescribed Valtrex for this in the past.  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 She suspects this might be due to her shoes squeezing her toes together. She also mentions not having regular periods, which she is unsure if it's due to her eating habits or her Nexplanon implant.      Patient Active Problem List   Diagnosis Date Noted   Suicidal ideation 12/19/2015   MDD (major depressive disorder), recurrent episode, severe ([facility]) 12/18/2015   Insomnia 06/09/2015   Anxiety disorder of adolescence 06/09/2015   Severe episode of recurrent major depressive disorder, without psychotic features ([facility]) 06/02/2015   Alternating  constipation and diarrhea    Nausea & vomiting 09/29/2011   IBS (irritable bowel syndrome) 06/28/2011   GERD (gastroesophageal reflux disease) 06/28/2011   Back pain 08/01/2010   DYSURIA 08/17/2009   ACUTE PHARYNGITIS 04/06/2009   ACUTE BRONCHITIS 04/06/2009   Acute gastritis 04/27/2007   Anxiety state 02/11/2007   Allergic rhinitis 02/11/2007   GERD 12/25/2006   LOOSE STOOLS 12/25/2006   RUQ PAIN 12/25/2006   Past Medical History:  Diagnosis Date   Anxiety    Anxiety disorder of adolescence 06/09/2015   Arm fracture    Asthma    Constipation    alternates with diarrhea   Depression    Diarrhea    alternates with constipation   GERD (gastroesophageal reflux disease)    Inflammatory bowel disease    Insomnia 06/09/2015   Past Surgical History:  Procedure Laterality Date   NO PAST SURGERIES     Social History   Tobacco Use   Smoking status: Former    Current packs/day: 0.25    Average packs/day: 0.3 packs/day for 1 year (0.3 ttl pk-yrs)    Types: Cigarettes   Smokeless tobacco: Current  Vaping Use   Vaping status: Some Days  Substance Use Topics   Alcohol use: No   Drug use: Yes    Types: Marijuana   Social History   Socioeconomic History   Marital status: Married    Spouse name: Not on file   Number of children: 0   Years of education: 12   Highest education level: Not on file  Occupational History   Occupation: Engineer, maintenance  [employer]  Tobacco Use   Smoking status: Former    Current packs/day: 0.25    Average packs/day: 0.3 packs/day for 1 year (0.3 ttl pk-yrs)    Types: Cigarettes   Smokeless tobacco: Current  Vaping Use   Vaping status: Some Days  Substance and Sexual Activity   Alcohol use: No   Drug use: Yes    Types: Marijuana   Sexual activity: Yes    Partners: Male    Birth control/protection: Implant  Other Topics Concern   Not on file  Social History Narrative   Lives with mother, Bonita Quin   Caffeine use: 1/day - coffee/tea   Social Determinants  of Health   Financial Resource Strain: Not on file  Food Insecurity: Not on file  Transportation Needs: Not on file  Physical Activity: Not on file  Stress: Not on file  Social Connections: Unknown (08/07/2021)   [facility]   Social Network    Social Network: Not on file  Intimate Partner Violence: Unknown (06/29/2021)   Received from [facility]   HITS    Physically Hurt: Not on file    Insult or Talk Down To: Not on file    Threaten Physical Harm: Not on file    Scream or Curse: Not on file   Family Status  Relation Name Status   Mother  Alive   Father  Alive   MGM  Deceased   MGF  Alive   Mat Aunt  Alive   Mat Aunt  (Not Specified)   Nurse, mental health  (Not Specified)   Other  (Not Specified)   Neg Hx  (Not Specified)  No partnership data on file   Family History  Problem Relation Age of Onset   Drug abuse Mother    Depression Mother    Drug abuse Father    Diabetes Maternal Grandmother    Parkinson's disease Maternal Grandmother    Diabetes Maternal Grandfather    Cancer Maternal Grandfather        leukemia   Diabetes Maternal Aunt    Cancer Maternal Aunt        renal   Depression Maternal Aunt    Alcohol abuse Maternal Aunt    Depression Maternal Aunt    Alcohol abuse Maternal Uncle    Depression Maternal Uncle    Coronary artery disease Other    Diabetes Other        paternal side  + hx diabetes   Other Other        Brain Tumor   Stomach cancer Neg Hx    Esophageal cancer Neg Hx    Allergies  Allergen Reactions   Bupropion Hives and Palpitations   Menotropins       Review of Systems  Constitutional:  Negative for chills, fever and malaise/fatigue.  HENT:  Negative for congestion and hearing loss.   Eyes:  Negative for blurred vision and discharge.  Respiratory:  Negative for cough, sputum production and shortness of breath.   Cardiovascular:  Negative for chest pain, palpitations and leg swelling.  Gastrointestinal:  Negative for  abdominal pain, blood in stool, constipation, diarrhea, heartburn, nausea and vomiting.  Genitourinary:  Negative for dysuria, frequency, hematuria and urgency.  Musculoskeletal:  Negative for back pain, falls and myalgias.  Skin:  Negative for rash.  Neurological:  Negative for dizziness, sensory change, loss of consciousness, weakness and headaches.  Endo/Heme/Allergies:  Negative for environmental allergies. Does not bruise/bleed easily.  Psychiatric/Behavioral:  Negative for depression  and suicidal ideas. The patient is not nervous/anxious and does not have insomnia.       Objective:     BP 108/78 (BP Location: Left Arm, Patient Position: Sitting, Cuff Size: Normal)   Pulse 76   Temp 98.4 F (36.9 C) (Oral)   Resp 18   Ht 5\' 3"  (1.6 m)   SpO2 98%   BMI 20.90 kg/m  BP Readings from Last 3 Encounters:  01/16/23 108/78  04/23/19 129/85  03/11/19 106/71   Wt Readings from Last 3 Encounters:  04/23/19 118 lb (53.5 kg)  03/11/19 116 lb (52.6 kg)  08/21/18 115 lb (52.2 kg)   SpO2 Readings from Last 3 Encounters:  01/16/23 98%  04/23/19 97%  03/11/19 100%      Physical Exam Vitals and nursing note reviewed.  Constitutional:      General: She is not in acute distress.    Appearance: Normal appearance. She is well-developed.  HENT:     Head: Normocephalic and atraumatic.     Right Ear: Tympanic membrane, ear canal and external ear normal. There is no impacted cerumen.     Left Ear: Tympanic membrane, ear canal and external ear normal. There is no impacted cerumen.     Nose: Nose normal.     Mouth/Throat:     Mouth: Mucous membranes are moist.     Pharynx: Oropharynx is clear. No oropharyngeal exudate or posterior oropharyngeal erythema.  Eyes:     General: No scleral icterus.       Right eye: No discharge.        Left eye: No discharge.     Conjunctiva/sclera: Conjunctivae normal.     Pupils: Pupils are equal, round, and reactive to light.  Neck:     Thyroid: No  thyromegaly or thyroid tenderness.     Vascular: No JVD.  Cardiovascular:     Rate and Rhythm: Normal rate and regular rhythm.     Heart sounds: Normal heart sounds. No murmur heard. Pulmonary:     Effort: Pulmonary effort is normal. No respiratory distress.     Breath sounds: Normal breath sounds.  Abdominal:     General: Bowel sounds are normal. There is no distension.     Palpations: Abdomen is soft. There is no mass.     Tenderness: There is no abdominal tenderness. There is no guarding or rebound.  Genitourinary:    Vagina: Normal.  Musculoskeletal:        General: Normal range of motion.     Cervical back: Normal range of motion and neck supple.     Right lower leg: No edema.     Left lower leg: No edema.  Lymphadenopathy:     Cervical: No cervical adenopathy.  Skin:    General: Skin is warm and dry.     Findings: No erythema or rash.  Neurological:     Mental Status: She is alert and oriented to person, place, and time.     Cranial Nerves: No cranial nerve deficit.     Deep Tendon Reflexes: Reflexes are normal and symmetric.  Psychiatric:        Mood and Affect: Mood normal.        Behavior: Behavior normal.        Thought Content: Thought content normal.        Judgment: Judgment normal.      No results found for any visits on 01/16/23.  Last CBC Lab Results  Component Value Date  WBC 6.1 03/11/2019   HGB 13.7 03/11/2019   HCT 41.7 03/11/2019   MCV 89.7 03/11/2019   MCH 29.5 03/11/2019   RDW 11.9 03/11/2019   PLT 315 03/11/2019   Last metabolic panel Lab Results  Component Value Date   GLUCOSE 112 (H) 03/11/2019   NA 137 03/11/2019   K 3.9 03/11/2019   CL 104 03/11/2019   CO2 24 03/11/2019   BUN 9 03/11/2019   CREATININE 0.86 03/11/2019   GFRNONAA >60 03/11/2019   CALCIUM 9.4 03/11/2019   PROT 7.2 03/11/2019   ALBUMIN 4.5 03/11/2019   BILITOT 0.8 03/11/2019   ALKPHOS 63 03/11/2019   AST 20 03/11/2019   ALT 16 03/11/2019   ANIONGAP 9  03/11/2019   Last lipids Lab Results  Component Value Date   CHOL 166 12/19/2015   HDL 43 12/19/2015   LDLCALC 100 (H) 12/19/2015   TRIG 113 12/19/2015   CHOLHDL 3.9 12/19/2015   Last hemoglobin A1c Lab Results  Component Value Date   HGBA1C 5.5 12/19/2015   Last thyroid functions Lab Results  Component Value Date   TSH 1.04 08/21/2018   T4TOTAL 6.1 12/19/2015   Last vitamin D No results found for: "25OHVITD2", "25OHVITD3", "VD25OH" Last vitamin B12 and Folate Lab Results  Component Value Date   VITAMINB12 687 02/21/2016   FOLATE 17.4 02/21/2016      The ASCVD Risk score (Arnett DK, et al., 2019) failed to calculate for the following reasons:   The 2019 ASCVD risk score is only valid for ages 2 to 61    Assessment & Plan:   Problem List Items Addressed This Visit       Unprioritized   GERD   Relevant Medications   ondansetron (ZOFRAN) 4 MG tablet   omeprazole (PRILOSEC) 20 MG capsule   Other Visit Diagnoses     Preventative health care    -  Primary   Relevant Medications   ondansetron (ZOFRAN) 4 MG tablet   omeprazole (PRILOSEC) 20 MG capsule   Other Relevant Orders   CBC with Differential/Platelet   Comprehensive metabolic panel   Lipid panel   Thyroid Panel With TSH   Magnesium   Phosphorus   Ferritin   VITAMIN D 25 Hydroxy (Vit-D Deficiency, Fractures)   Anorexia nervosa, restricting type, unspecified severity       Relevant Orders   CBC with Differential/Platelet   Comprehensive metabolic panel   Lipid panel   Thyroid Panel With TSH   Magnesium   Phosphorus   Ferritin   VITAMIN D 25 Hydroxy (Vit-D Deficiency, Fractures)   POCT Urinalysis Dipstick (Automated)   Mood disorder (HCC)       Depression with anxiety       Relevant Medications   PARoxetine (PAXIL) 30 MG tablet   hydrOXYzine (ATARAX) 10 MG tablet   Bilateral bunions       Relevant Orders   Ambulatory referral to Podiatry   H/O cold sores       Relevant Medications    valACYclovir (VALTREX) 1000 MG tablet   Need for influenza vaccination       Relevant Orders   Flu vaccine trivalent PF, 6mos and older(Flulaval,Afluria,Fluarix,Fluzone) (Completed)     Assessment and Plan    Mood Disorder History of hospitalizations for suicidal ideation, currently managed with Lamictal, Paxil, Minipress, and Hydroxyzine. Regular therapy sessions ongoing. -Continue current medications and therapy.  Eating Issues and Energy Levels Reports difficulty with eating and maintaining energy levels. Currently seeing a  nutritionist. -Order labs including TSH, phosphorus, serotonin, and vitamin D to assess overall health and potential causes of symptoms.  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 Possible bunion formation. -Refer to a podiatrist for further evaluation and treatment.  Herpes Labialis (Cold Sores) Reports approximately 1-2 outbreaks per year, managed with L-lysine and previously with Valtrex. -Prescribe Valtrex to be taken at the onset of symptoms.  Menstrual Irregularities Reports irregular periods since Nexplanon insertion, with increased regularity after recent replacement. -Advise patient to consult with gynecologist regarding changes in menstrual cycle.  General Health Maintenance -Administer influenza vaccine today. -Advise patient to consider COVID-19 booster in a few weeks, after receiving the flu shot.        Return in about 1 year (around 01/16/2024), or if symptoms worsen or fail to improve.    Donato Schultz, DO

## 2023-01-16 NOTE — Patient Instructions (Signed)

## 2023-01-16 NOTE — Telephone Encounter (Signed)
Pt left a urine yesterday 10/24. Test was ordered and then canceled. Is this not needed any longer?   Sample was saved until answer Is determined.

## 2023-01-17 LAB — COMPREHENSIVE METABOLIC PANEL
ALT: 10 U/L (ref 0–35)
AST: 16 U/L (ref 0–37)
Albumin: 4.9 g/dL (ref 3.5–5.2)
Alkaline Phosphatase: 67 U/L (ref 39–117)
BUN: 13 mg/dL (ref 6–23)
CO2: 28 meq/L (ref 19–32)
Calcium: 9.7 mg/dL (ref 8.4–10.5)
Chloride: 102 meq/L (ref 96–112)
Creatinine, Ser: 0.63 mg/dL (ref 0.40–1.20)
GFR: 123.82 mL/min (ref >60.00)
Glucose, Bld: 86 mg/dL (ref 70–99)
Potassium: 4.4 meq/L (ref 3.5–5.1)
Sodium: 139 meq/L (ref 135–145)
Total Bilirubin: 0.4 mg/dL (ref 0.2–1.2)
Total Protein: 7.4 g/dL (ref 6.0–8.3)

## 2023-01-17 LAB — LIPID PANEL
Cholesterol: 210 mg/dL — ABNORMAL HIGH (ref 0–200)
HDL: 49.6 mg/dL (ref >39.00)
LDL Cholesterol: 130 mg/dL — ABNORMAL HIGH (ref 0–99)
NonHDL: 160.88
Total CHOL/HDL Ratio: 4
Triglycerides: 153 mg/dL — ABNORMAL HIGH (ref 0.0–149.0)
VLDL: 30.6 mg/dL (ref 0.0–40.0)

## 2023-01-17 LAB — POC URINALSYSI DIPSTICK (AUTOMATED)
Bilirubin, UA: NEGATIVE
Glucose, UA: NEGATIVE
Ketones, UA: NEGATIVE
Leukocytes, UA: NEGATIVE
Nitrite, UA: NEGATIVE
Protein, UA: NEGATIVE
Spec Grav, UA: 1.01 (ref 1.010–1.025)
Urobilinogen, UA: 0.2 U/dL
pH, UA: 6 (ref 5.0–8.0)

## 2023-01-17 LAB — CBC WITH DIFFERENTIAL/PLATELET
Basophils Absolute: 0.1 10*3/uL (ref 0.0–0.1)
Basophils Relative: 1 % (ref 0.0–3.0)
Eosinophils Absolute: 0.1 10*3/uL (ref 0.0–0.7)
Eosinophils Relative: 1.6 % (ref 0.0–5.0)
HCT: 41.1 % (ref 36.0–46.0)
Hemoglobin: 13.4 g/dL (ref 12.0–15.0)
Lymphocytes Relative: 35.4 % (ref 12.0–46.0)
Lymphs Abs: 2.9 10*3/uL (ref 0.7–4.0)
MCHC: 32.6 g/dL (ref 30.0–36.0)
MCV: 90.6 fL (ref 78.0–100.0)
Monocytes Absolute: 0.6 10*3/uL (ref 0.1–1.0)
Monocytes Relative: 7 % (ref 3.0–12.0)
Neutro Abs: 4.4 10*3/uL (ref 1.4–7.7)
Neutrophils Relative %: 55 % (ref 43.0–77.0)
Platelets: 348 10*3/uL (ref 150.0–400.0)
RBC: 4.54 Mil/uL (ref 3.87–5.11)
RDW: 12.5 % (ref 11.5–15.5)
WBC: 8.1 10*3/uL (ref 4.0–10.5)

## 2023-01-17 LAB — VITAMIN D 25 HYDROXY (VIT D DEFICIENCY, FRACTURES): VITD: 39.9 ng/mL (ref 30.00–100.00)

## 2023-01-17 LAB — THYROID PANEL WITH TSH
Free Thyroxine Index: 1.8 (ref 1.4–3.8)
T3 Uptake: 28 % (ref 22–35)
T4, Total: 6.3 ug/dL (ref 5.1–11.9)
TSH: 0.89 m[IU]/L

## 2023-01-17 LAB — PHOSPHORUS: Phosphorus: 4.2 mg/dL (ref 2.3–4.6)

## 2023-01-17 LAB — MAGNESIUM: Magnesium: 2.1 mg/dL (ref 1.5–2.5)

## 2023-01-17 LAB — FERRITIN: Ferritin: 28.4 ng/mL (ref 10.0–291.0)

## 2023-01-17 NOTE — Telephone Encounter (Signed)
 It has been resulted.

## 2023-01-23 DIAGNOSIS — Z713 Dietary counseling and surveillance: Secondary | ICD-10-CM | POA: Diagnosis not present

## 2023-01-24 DIAGNOSIS — Z419 Encounter for procedure for purposes other than remedying health state, unspecified: Secondary | ICD-10-CM | POA: Diagnosis not present

## 2023-01-28 DIAGNOSIS — F411 Generalized anxiety disorder: Secondary | ICD-10-CM | POA: Diagnosis not present

## 2023-02-06 ENCOUNTER — Ambulatory Visit (INDEPENDENT_AMBULATORY_CARE_PROVIDER_SITE_OTHER): Payer: Medicaid Other

## 2023-02-06 ENCOUNTER — Ambulatory Visit (INDEPENDENT_AMBULATORY_CARE_PROVIDER_SITE_OTHER): Payer: Medicaid Other | Admitting: Podiatry

## 2023-02-06 DIAGNOSIS — Z713 Dietary counseling and surveillance: Secondary | ICD-10-CM | POA: Diagnosis not present

## 2023-02-06 DIAGNOSIS — M21612 Bunion of left foot: Secondary | ICD-10-CM

## 2023-02-06 DIAGNOSIS — M21611 Bunion of right foot: Secondary | ICD-10-CM | POA: Diagnosis not present

## 2023-02-06 NOTE — Progress Notes (Signed)
Subjective:  Patient ID: Dana [name], female    DOB: [birth year],  MRN: [id number]  No chief complaint on file.   27 y.o. female presents with concern for pain in bilateral foot due to bunion deformity.  Says the left foot hurts worse than the right.  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  She does have hypermobility in her joints and states she has a genetic history of bunions as well as her grandmother had bad feet.  Does not have any known diagnosis of hypermobility or connective tissue disorder such as Ehlers-Danlos or Marfan syndrome.   Past Medical History:  Diagnosis Date   Anxiety    Anxiety disorder of adolescence 06/09/2015   Arm fracture    Asthma    Constipation    alternates with diarrhea   Depression    Diarrhea    alternates with constipation   GERD (gastroesophageal reflux disease)    Inflammatory bowel disease    Insomnia 06/09/2015    Allergies  Allergen Reactions   Bupropion Hives and Palpitations   Menotropins     ROS: Negative except as per HPI above  Objective:  General: AAO x3, NAD  Dermatological: With inspection and palpation of the right and left lower extremities there are no open sores, no preulcerative lesions, no rash or signs of infection present. Nails are of normal length thickness and coloration.   Vascular:  Dorsalis Pedis artery and Posterior Tibial artery pedal pulses are 2/4 bilateral.  Capillary fill time < 3 sec to all digits.   Neruologic: Grossly intact via light touch bilateral. Protective threshold intact to all sites bilateral.   Musculoskeletal: Moderate to severe hallux abductovalgus deformity noted bilateral foot with prominence of the medial eminence and pain about the first MPJ bilaterally.  Left foot worse than right with her pain.  Good dorsiflexion plantarflexion range of motion available at bilateral first MPJ without osseous spurring at the joint.  There is significant hypermobility of the  first ray noted with significant dorsal plantar excursion at the first TMT J bilateral foot.  Gait: Unassisted, Nonantalgic.   No images are attached to the encounter.  Radiographs:  Date: 02/06/2023 XR both feet Weightbearing AP/Lateral/Oblique   Findings: Bilateral foot with increased first and metatarsal space angle as well as increased hallux valgus angle.  Abnormal tibial sesamoid position.  Overall moderate to severe bunion deformity Assessment:   1. Bilateral bunions      Plan:  Patient was evaluated and treated and all questions answered.  # Bilateral hallux abductovalgus.  Left more symptomatic than right at this time -Discussed with the patient conservative options which she has failed including different shoe gear padding and splinting -As she has failed conservative therapies recommend proceeding with bunionectomy either via minimally invasive surgery with distal first metatarsal osteotomy or Lapidus bunionectomy -Discussed the risk benefits alternatives and possible complications associate with both procedures. -Given her height for mobility and concern for possible hypermobility disorder or soft tissue connectivity disorder recommend Lapidus bunionectomy for prevention of recurrence of deformity in the long-term. -Discussed with patient she may also need Akin osteotomy and possible metatarsal osteotomy for length pattern should that need to be addressed following correction of the bunion with the Lapidus -Patient is agreement with this plan and all questions were answered informed surgical site was obtained.  She is aware of post surgical recovery course.  Will begin surgical planning  Corinna Gab, DPM [facility] / [facility]

## 2023-02-10 DIAGNOSIS — F410 Panic disorder [episodic paroxysmal anxiety] without agoraphobia: Secondary | ICD-10-CM | POA: Diagnosis not present

## 2023-02-10 DIAGNOSIS — F3181 Bipolar II disorder: Secondary | ICD-10-CM | POA: Diagnosis not present

## 2023-02-10 DIAGNOSIS — F4312 Post-traumatic stress disorder, chronic: Secondary | ICD-10-CM | POA: Diagnosis not present

## 2023-02-10 DIAGNOSIS — F411 Generalized anxiety disorder: Secondary | ICD-10-CM | POA: Diagnosis not present

## 2023-02-11 DIAGNOSIS — F411 Generalized anxiety disorder: Secondary | ICD-10-CM | POA: Diagnosis not present

## 2023-02-13 ENCOUNTER — Telehealth: Payer: Self-pay

## 2023-02-13 NOTE — Telephone Encounter (Signed)
Received surgery paperwork from Dr. Annamary Rummage. Left a message for Echo to call and schedule surgery.

## 2023-02-14 ENCOUNTER — Telehealth: Payer: Self-pay | Admitting: Podiatry

## 2023-02-14 NOTE — Telephone Encounter (Signed)
Pt called returning a call and is ready to set up a surgery date with Dr.Standiford. The best number to reach her is [phone].

## 2023-02-18 ENCOUNTER — Encounter: Payer: Self-pay | Admitting: Family Medicine

## 2023-02-18 ENCOUNTER — Ambulatory Visit (INDEPENDENT_AMBULATORY_CARE_PROVIDER_SITE_OTHER): Payer: BLUE CROSS/BLUE SHIELD | Admitting: Family Medicine

## 2023-02-18 VITALS — BP 110/60 | HR 80 | Temp 97.8°F | Resp 18 | Ht 63.0 in | Wt 136.6 lb

## 2023-02-18 DIAGNOSIS — M357 Hypermobility syndrome: Secondary | ICD-10-CM | POA: Diagnosis not present

## 2023-02-18 DIAGNOSIS — Q796 Ehlers-Danlos syndrome, unspecified: Secondary | ICD-10-CM | POA: Diagnosis not present

## 2023-02-18 NOTE — Addendum Note (Signed)
Addended by: Seabron Spates R on: 02/18/2023 09:04 PM   Modules accepted: Orders

## 2023-02-18 NOTE — Progress Notes (Signed)
Established Patient Office Visit  Subjective   Patient ID: MACAULEY BICK, female    DOB: 04/11/1995  Age: 27 y.o. MRN: [id number]  Chief Complaint  Patient presents with   follow up from Podiatry    Pt says Podiatrist indicated that she might have a hyper mobility disorder and he would like for her to be tested for it. She would like to hear your thoughts on the surgery as well.     HPI Pt is a 27 yo female who presents today at the request of the podiatrist , who felt she was hypermoble.  The pt states a chiropractor has always told her that as well.  No other complaints Patient Active Problem List   Diagnosis Date Noted   Suicidal ideation 12/19/2015   MDD (major depressive disorder), recurrent episode, severe (HCC) 12/18/2015   Insomnia 06/09/2015   Anxiety disorder of adolescence 06/09/2015   Severe episode of recurrent major depressive disorder, without psychotic features (HCC) 06/02/2015   Alternating constipation and diarrhea    Nausea & vomiting 09/29/2011   IBS (irritable bowel syndrome) 06/28/2011   GERD (gastroesophageal reflux disease) 06/28/2011   Back pain 08/01/2010   DYSURIA 08/17/2009   ACUTE PHARYNGITIS 04/06/2009   ACUTE BRONCHITIS 04/06/2009   Acute gastritis 04/27/2007   Anxiety state 02/11/2007   Allergic rhinitis 02/11/2007   GERD 12/25/2006   LOOSE STOOLS 12/25/2006   RUQ PAIN 12/25/2006   Past Medical History:  Diagnosis Date   Anxiety    Anxiety disorder of adolescence 06/09/2015   Arm fracture    Asthma    Constipation    alternates with diarrhea   Depression    Diarrhea    alternates with constipation   GERD (gastroesophageal reflux disease)    Inflammatory bowel disease    Insomnia 06/09/2015   Past Surgical History:  Procedure Laterality Date   NO PAST SURGERIES     Social History   Tobacco Use   Smoking status: Former    Current packs/day: 0.25    Average packs/day: 0.3 packs/day for 1 year (0.3 ttl pk-yrs)    Types:  Cigarettes   Smokeless tobacco: Current  Vaping Use   Vaping status: Some Days  Substance Use Topics   Alcohol use: No   Drug use: Yes    Types: Marijuana   Social History   Socioeconomic History   Marital status: Married    Spouse name: Not on file   Number of children: 0   Years of education: 12   Highest education level: Not on file  Occupational History   Occupation: Freight forwarder  Tobacco Use   Smoking status: Former    Current packs/day: 0.25    Average packs/day: 0.3 packs/day for 1 year (0.3 ttl pk-yrs)    Types: Cigarettes   Smokeless tobacco: Current  Vaping Use   Vaping status: Some Days  Substance and Sexual Activity   Alcohol use: No   Drug use: Yes    Types: Marijuana   Sexual activity: Yes    Partners: Male    Birth control/protection: Implant  Other Topics Concern   Not on file  Social History Narrative   Lives with mother, Bonita Quin   Caffeine use: 1/day - coffee/tea   Social Determinants of Health   Financial Resource Strain: Not on file  Food Insecurity: Not on file  Transportation Needs: Not on file  Physical Activity: Not on file  Stress: Not on file  Social Connections: Unknown (08/07/2021)  Received from [facility]   Social Network    Social Network: Not on file  Intimate Partner Violence: Unknown (06/29/2021)   Received from [facility]   HITS    Physically Hurt: Not on file    Insult or Talk Down To: Not on file    Threaten Physical Harm: Not on file    Scream or Curse: Not on file   Family Status  Relation Name Status   Mother  Alive   Father  Alive   MGM  Deceased   MGF  Alive   Mat Aunt  Alive   Mat Aunt  (Not Specified)   Nurse, mental health  (Not Specified)   Other  (Not Specified)   Neg Hx  (Not Specified)  No partnership data on file   Family History  Problem Relation Age of Onset   Drug abuse Mother    Depression Mother    Drug abuse Father    Diabetes Maternal Grandmother    Parkinson's disease Maternal Grandmother     Diabetes Maternal Grandfather    Cancer Maternal Grandfather        leukemia   Diabetes Maternal Aunt    Cancer Maternal Aunt        renal   Depression Maternal Aunt    Alcohol abuse Maternal Aunt    Depression Maternal Aunt    Alcohol abuse Maternal Uncle    Depression Maternal Uncle    Coronary artery disease Other    Diabetes Other        paternal side  + hx diabetes   Other Other        Brain Tumor   Stomach cancer Neg Hx    Esophageal cancer Neg Hx    Allergies  Allergen Reactions   Bupropion Hives and Palpitations   Menotropins       Review of Systems  Constitutional:  Negative for fever and malaise/fatigue.  HENT:  Negative for congestion.   Eyes:  Negative for blurred vision.  Respiratory:  Negative for cough and shortness of breath.   Cardiovascular:  Negative for chest pain, palpitations and leg swelling.  Gastrointestinal:  Negative for vomiting.  Musculoskeletal:  Negative for back pain and joint pain.  Skin:  Negative for rash.  Neurological:  Negative for loss of consciousness and headaches.      Objective:     BP 110/60 (BP Location: Left Arm, Patient Position: Sitting, Cuff Size: Normal)   Pulse 80   Temp 97.8 F (36.6 C) (Temporal)   Resp 18   Ht 5\' 3"  (1.6 m)   Wt 136 lb 9.6 oz (62 kg)   SpO2 98%   BMI 24.20 kg/m  BP Readings from Last 3 Encounters:  02/18/23 110/60  01/16/23 108/78  04/23/19 129/85   Wt Readings from Last 3 Encounters:  02/18/23 136 lb 9.6 oz (62 kg)  01/16/23 134 lb 6.4 oz (61 kg)  04/23/19 118 lb (53.5 kg)   SpO2 Readings from Last 3 Encounters:  02/18/23 98%  01/16/23 98%  04/23/19 97%      Physical Exam Vitals and nursing note reviewed.  Constitutional:      General: She is not in acute distress.    Appearance: Normal appearance. She is well-developed.  HENT:     Head: Normocephalic and atraumatic.  Eyes:     General: No scleral icterus.       Right eye: No discharge.        Left eye: No discharge.  Cardiovascular:     Rate and Rhythm: Normal rate and regular rhythm.     Heart sounds: No murmur heard. Pulmonary:     Effort: Pulmonary effort is normal. No respiratory distress.     Breath sounds: Normal breath sounds.  Musculoskeletal:        General: Normal range of motion.     Cervical back: Normal range of motion and neck supple.     Right lower leg: No edema.     Left lower leg: No edema.  Skin:    General: Skin is warm and dry.  Neurological:     Mental Status: She is alert and oriented to person, place, and time.  Psychiatric:        Mood and Affect: Mood normal.        Behavior: Behavior normal.        Thought Content: Thought content normal.        Judgment: Judgment normal.      No results found for any visits on 02/18/23.  Last CBC Lab Results  Component Value Date   WBC 8.1 01/16/2023   HGB 13.4 01/16/2023   HCT 41.1 01/16/2023   MCV 90.6 01/16/2023   MCH 29.5 03/11/2019   RDW 12.5 01/16/2023   PLT 348.0 01/16/2023   Last metabolic panel Lab Results  Component Value Date   GLUCOSE 86 01/16/2023   NA 139 01/16/2023   K 4.4 01/16/2023   CL 102 01/16/2023   CO2 28 01/16/2023   BUN 13 01/16/2023   CREATININE 0.63 01/16/2023   GFR 123.82 01/16/2023   CALCIUM 9.7 01/16/2023   PHOS 4.2 01/16/2023   PROT 7.4 01/16/2023   ALBUMIN 4.9 01/16/2023   BILITOT 0.4 01/16/2023   ALKPHOS 67 01/16/2023   AST 16 01/16/2023   ALT 10 01/16/2023   ANIONGAP 9 03/11/2019   Last lipids Lab Results  Component Value Date   CHOL 210 (H) 01/16/2023   HDL 49.60 01/16/2023   LDLCALC 130 (H) 01/16/2023   TRIG 153.0 (H) 01/16/2023   CHOLHDL 4 01/16/2023   Last hemoglobin A1c Lab Results  Component Value Date   HGBA1C 5.5 12/19/2015   Last thyroid functions Lab Results  Component Value Date   TSH 0.89 01/16/2023   T4TOTAL 6.3 01/16/2023      The ASCVD Risk score (Arnett DK, et al., 2019) failed to calculate for the following reasons:   The 2019 ASCVD risk  score is only valid for ages 61 to 38    Assessment & Plan:   Problem List Items Addressed This Visit   None Visit Diagnoses     Hypermobility syndrome    -  Primary   Relevant Orders   Ambulatory referral to Genetics       No follow-ups on file.    Donato Schultz, DO

## 2023-02-18 NOTE — Assessment & Plan Note (Signed)
Refer to genetics for testing

## 2023-02-23 DIAGNOSIS — Z419 Encounter for procedure for purposes other than remedying health state, unspecified: Secondary | ICD-10-CM | POA: Diagnosis not present

## 2023-02-25 DIAGNOSIS — F411 Generalized anxiety disorder: Secondary | ICD-10-CM | POA: Diagnosis not present

## 2023-02-26 DIAGNOSIS — Z0120 Encounter for dental examination and cleaning without abnormal findings: Secondary | ICD-10-CM | POA: Diagnosis not present

## 2023-02-27 DIAGNOSIS — Z713 Dietary counseling and surveillance: Secondary | ICD-10-CM | POA: Diagnosis not present

## 2023-03-06 ENCOUNTER — Ambulatory Visit: Payer: BLUE CROSS/BLUE SHIELD | Admitting: Family Medicine

## 2023-03-06 ENCOUNTER — Encounter: Payer: Self-pay | Admitting: Family Medicine

## 2023-03-06 VITALS — BP 110/80 | HR 79 | Temp 98.4°F | Resp 18 | Ht 63.0 in | Wt 137.4 lb

## 2023-03-06 DIAGNOSIS — M791 Myalgia, unspecified site: Secondary | ICD-10-CM | POA: Diagnosis not present

## 2023-03-06 DIAGNOSIS — R52 Pain, unspecified: Secondary | ICD-10-CM | POA: Insufficient documentation

## 2023-03-06 DIAGNOSIS — H9203 Otalgia, bilateral: Secondary | ICD-10-CM

## 2023-03-06 DIAGNOSIS — Z713 Dietary counseling and surveillance: Secondary | ICD-10-CM | POA: Diagnosis not present

## 2023-03-06 LAB — POC COVID19 BINAXNOW: SARS Coronavirus 2 Ag: NEGATIVE

## 2023-03-06 LAB — POC INFLUENZA A&B (BINAX/QUICKVUE)
Influenza A, POC: NEGATIVE
Influenza B, POC: NEGATIVE

## 2023-03-06 NOTE — Assessment & Plan Note (Signed)
+   FLUID  USE FLONASE AND ANTIHISTAMINE--CALL us IF SYMPTOMS DO NOT IMPROVE OR WORSEN

## 2023-03-06 NOTE — Assessment & Plan Note (Signed)
FLU AND COVID NEG

## 2023-03-06 NOTE — Progress Notes (Signed)
Established Patient Office Visit  Subjective   Patient ID: Dana [name], female    DOB: Jan 19, 1996  Age: 27 y.o. MRN: [id number]  Chief Complaint  Patient presents with   Ear Pain    Bilateral ear pain, more so right ear, pt reports body aches. Ear pain started 2 weeks ago, body aches started yesterday.     HPI Discussed the use of AI scribe software for clinical note transcription with the patient, who gave verbal consent to proceed.  History of Present Illness   The patient presented with a chief complaint of worsening ear pain that started a couple of weeks ago. Initially, the discomfort was mild and attributed to ear wax, for which she used ear drops. However, the pain has since escalated. In addition, she reported the onset of body aches all over her body the day before the consultation. She denied having any fever.  The patient also mentioned a genetic condition, erlostanolose, but reported no symptoms related to it. She did, however, report ongoing issues with her feet, for which she was seeing a foot specialist.  Regarding her ear symptoms, the patient described a sensation of fluid in the ear, akin to feeling underwater. She denied any concurrent illnesses in her household and reported no sore throat. She also mentioned using a nurse due to the start of a particular season, but did not specify further.  The patient denied using Flonase or any similar medication for her ear symptoms. She was also tested for flu and COVID-19, but the results were not discussed in the conversation.      Patient Active Problem List   Diagnosis Date Noted   Otalgia of both ears 03/06/2023   Body aches 03/06/2023   Hypermobility syndrome 02/18/2023   Suicidal ideation 12/19/2015   MDD (major depressive disorder), recurrent episode, severe (HCC) 12/18/2015   Insomnia 06/09/2015   Anxiety disorder of adolescence 06/09/2015   Severe episode of recurrent major depressive disorder, without  psychotic features (HCC) 06/02/2015   Alternating constipation and diarrhea    Nausea & vomiting 09/29/2011   IBS (irritable bowel syndrome) 06/28/2011   GERD (gastroesophageal reflux disease) 06/28/2011   Back pain 08/01/2010   DYSURIA 08/17/2009   ACUTE PHARYNGITIS 04/06/2009   ACUTE BRONCHITIS 04/06/2009   Acute gastritis 04/27/2007   Anxiety state 02/11/2007   Allergic rhinitis 02/11/2007   GERD 12/25/2006   LOOSE STOOLS 12/25/2006   RUQ PAIN 12/25/2006   Past Medical History:  Diagnosis Date   Anxiety    Anxiety disorder of adolescence 06/09/2015   Arm fracture    Asthma    Constipation    alternates with diarrhea   Depression    Diarrhea    alternates with constipation   GERD (gastroesophageal reflux disease)    Inflammatory bowel disease    Insomnia 06/09/2015   Past Surgical History:  Procedure Laterality Date   NO PAST SURGERIES     Social History   Tobacco Use   Smoking status: Former    Current packs/day: 0.25    Average packs/day: 0.3 packs/day for 1 year (0.3 ttl pk-yrs)    Types: Cigarettes   Smokeless tobacco: Current  Vaping Use   Vaping status: Some Days  Substance Use Topics   Alcohol use: No   Drug use: Yes    Types: Marijuana   Social History   Socioeconomic History   Marital status: Married    Spouse name: Not on file   Number of children: 0  Years of education: 77   Highest education level: Not on file  Occupational History   Occupation: [employer]  Tobacco Use   Smoking status: Former    Current packs/day: 0.25    Average packs/day: 0.3 packs/day for 1 year (0.3 ttl pk-yrs)    Types: Cigarettes   Smokeless tobacco: Current  Vaping Use   Vaping status: Some Days  Substance and Sexual Activity   Alcohol use: No   Drug use: Yes    Types: Marijuana   Sexual activity: Yes    Partners: Male    Birth control/protection: Implant  Other Topics Concern   Not on file  Social History Narrative   Lives with mother, Bonita Quin    Caffeine use: 1/day - coffee/tea   Social Drivers of Corporate investment banker Strain: Not on file  Food Insecurity: Not on file  Transportation Needs: Not on file  Physical Activity: Not on file  Stress: Not on file  Social Connections: Unknown (08/07/2021)   Received from [facility]   Social Network    Social Network: Not on file  Intimate Partner Violence: Unknown (06/29/2021)   Received from [facility]   HITS    Physically Hurt: Not on file    Insult or Talk Down To: Not on file    Threaten Physical Harm: Not on file    Scream or Curse: Not on file   Family Status  Relation Name Status   Mother  Alive   Father  Alive   MGM  Deceased   MGF  Alive   Mat Aunt  Alive   Mat Aunt  (Not Specified)   Nurse, mental health  (Not Specified)   Other  (Not Specified)   Neg Hx  (Not Specified)  No partnership data on file   Family History  Problem Relation Age of Onset   Drug abuse Mother    Depression Mother    Drug abuse Father    Diabetes Maternal Grandmother    Parkinson's disease Maternal Grandmother    Diabetes Maternal Grandfather    Cancer Maternal Grandfather        leukemia   Diabetes Maternal Aunt    Cancer Maternal Aunt        renal   Depression Maternal Aunt    Alcohol abuse Maternal Aunt    Depression Maternal Aunt    Alcohol abuse Maternal Uncle    Depression Maternal Uncle    Coronary artery disease Other    Diabetes Other        paternal side  + hx diabetes   Other Other        Brain Tumor   Stomach cancer Neg Hx    Esophageal cancer Neg Hx    Allergies  Allergen Reactions   Bupropion Hives and Palpitations   Other Itching    MINT      Review of Systems  Constitutional:  Negative for chills, fever and malaise/fatigue.  HENT:  Negative for congestion and hearing loss.   Eyes:  Negative for blurred vision and discharge.  Respiratory:  Negative for cough, sputum production and shortness of breath.   Cardiovascular:  Negative for chest pain,  palpitations and leg swelling.  Gastrointestinal:  Negative for abdominal pain, blood in stool, constipation, diarrhea, heartburn, nausea and vomiting.  Genitourinary:  Negative for dysuria, frequency, hematuria and urgency.  Musculoskeletal:  Negative for back pain, falls and myalgias.  Skin:  Negative for rash.  Neurological:  Negative for dizziness, sensory change,  loss of consciousness, weakness and headaches.  Endo/Heme/Allergies:  Negative for environmental allergies. Does not bruise/bleed easily.  Psychiatric/Behavioral:  Negative for depression and suicidal ideas. The patient is not nervous/anxious and does not have insomnia.       Objective:     BP 110/80 (BP Location: Right Arm, Patient Position: Sitting)   Pulse 79   Temp 98.4 F (36.9 C) (Oral)   Resp 18   Ht 5\' 3"  (1.6 m)   Wt 137 lb 6.4 oz (62.3 kg)   SpO2 98%   BMI 24.34 kg/m  BP Readings from Last 3 Encounters:  03/06/23 110/80  02/18/23 110/60  01/16/23 108/78   Wt Readings from Last 3 Encounters:  03/06/23 137 lb 6.4 oz (62.3 kg)  02/18/23 136 lb 9.6 oz (62 kg)  01/16/23 134 lb 6.4 oz (61 kg)   SpO2 Readings from Last 3 Encounters:  03/06/23 98%  02/18/23 98%  01/16/23 98%      Physical Exam Vitals and nursing note reviewed.  Constitutional:      General: She is not in acute distress.    Appearance: Normal appearance. She is well-developed.  HENT:     Head: Normocephalic and atraumatic.     Right Ear: A middle ear effusion is present. Tympanic membrane is injected and bulging. Tympanic membrane is not perforated or erythematous.     Left Ear: A middle ear effusion is present. Tympanic membrane is injected and bulging. Tympanic membrane is not perforated or erythematous.  Eyes:     General: No scleral icterus.       Right eye: No discharge.        Left eye: No discharge.  Cardiovascular:     Rate and Rhythm: Normal rate and regular rhythm.     Heart sounds: No murmur heard. Pulmonary:      Effort: Pulmonary effort is normal. No respiratory distress.     Breath sounds: Normal breath sounds.  Musculoskeletal:        General: Normal range of motion.     Cervical back: Normal range of motion and neck supple.     Right lower leg: No edema.     Left lower leg: No edema.  Skin:    General: Skin is warm and dry.  Neurological:     Mental Status: She is alert and oriented to person, place, and time.  Psychiatric:        Mood and Affect: Mood normal.        Behavior: Behavior normal.        Thought Content: Thought content normal.        Judgment: Judgment normal.      Results for orders placed or performed in visit on 03/06/23  POC Influenza A&B (Binax test)  Result Value Ref Range   Influenza A, POC Negative Negative   Influenza B, POC Negative Negative  POC COVID-19  Result Value Ref Range   SARS Coronavirus 2 Ag Negative Negative    Last CBC Lab Results  Component Value Date   WBC 8.1 01/16/2023   HGB 13.4 01/16/2023   HCT 41.1 01/16/2023   MCV 90.6 01/16/2023   MCH 29.5 03/11/2019   RDW 12.5 01/16/2023   PLT 348.0 01/16/2023   Last metabolic panel Lab Results  Component Value Date   GLUCOSE 86 01/16/2023   NA 139 01/16/2023   K 4.4 01/16/2023   CL 102 01/16/2023   CO2 28 01/16/2023   BUN 13 01/16/2023   CREATININE  0.63 01/16/2023   GFR 123.82 01/16/2023   CALCIUM 9.7 01/16/2023   PHOS 4.2 01/16/2023   PROT 7.4 01/16/2023   ALBUMIN 4.9 01/16/2023   BILITOT 0.4 01/16/2023   ALKPHOS 67 01/16/2023   AST 16 01/16/2023   ALT 10 01/16/2023   ANIONGAP 9 03/11/2019   Last lipids Lab Results  Component Value Date   CHOL 210 (H) 01/16/2023   HDL 49.60 01/16/2023   LDLCALC 130 (H) 01/16/2023   TRIG 153.0 (H) 01/16/2023   CHOLHDL 4 01/16/2023   Last hemoglobin A1c Lab Results  Component Value Date   HGBA1C 5.5 12/19/2015   Last thyroid functions Lab Results  Component Value Date   TSH 0.89 01/16/2023   T4TOTAL 6.3 01/16/2023   Last  vitamin D Lab Results  Component Value Date   VD25OH 39.90 01/16/2023   Last vitamin B12 and Folate Lab Results  Component Value Date   VITAMINB12 687 02/21/2016   FOLATE 17.4 02/21/2016      The ASCVD Risk score (Arnett DK, et al., 2019) failed to calculate for the following reasons:   The 2019 ASCVD risk score is only valid for ages 6 to 76    Assessment & Plan:   Problem List Items Addressed This Visit       Unprioritized   Otalgia of both ears   + FLUID  USE FLONASE AND ANTIHISTAMINE--CALL us IF SYMPTOMS DO NOT IMPROVE OR WORSEN      Body aches - Primary   FLU AND COVID NEG       Relevant Orders   POC Influenza A&B (Binax test) (Completed)   POC COVID-19 (Completed)    No follow-ups on file.    Donato Schultz, DO

## 2023-03-11 DIAGNOSIS — F411 Generalized anxiety disorder: Secondary | ICD-10-CM | POA: Diagnosis not present

## 2023-03-13 DIAGNOSIS — Z713 Dietary counseling and surveillance: Secondary | ICD-10-CM | POA: Diagnosis not present

## 2023-03-23 ENCOUNTER — Encounter: Payer: Self-pay | Admitting: Podiatry

## 2023-03-26 DIAGNOSIS — Z419 Encounter for procedure for purposes other than remedying health state, unspecified: Secondary | ICD-10-CM | POA: Diagnosis not present

## 2023-03-27 DIAGNOSIS — Z713 Dietary counseling and surveillance: Secondary | ICD-10-CM | POA: Diagnosis not present

## 2023-04-07 DIAGNOSIS — F4312 Post-traumatic stress disorder, chronic: Secondary | ICD-10-CM | POA: Diagnosis not present

## 2023-04-07 DIAGNOSIS — F3181 Bipolar II disorder: Secondary | ICD-10-CM | POA: Diagnosis not present

## 2023-04-07 DIAGNOSIS — F411 Generalized anxiety disorder: Secondary | ICD-10-CM | POA: Diagnosis not present

## 2023-04-07 DIAGNOSIS — F410 Panic disorder [episodic paroxysmal anxiety] without agoraphobia: Secondary | ICD-10-CM | POA: Diagnosis not present

## 2023-04-08 DIAGNOSIS — F411 Generalized anxiety disorder: Secondary | ICD-10-CM | POA: Diagnosis not present

## 2023-04-10 DIAGNOSIS — Z713 Dietary counseling and surveillance: Secondary | ICD-10-CM | POA: Diagnosis not present

## 2023-04-15 DIAGNOSIS — F411 Generalized anxiety disorder: Secondary | ICD-10-CM | POA: Diagnosis not present

## 2023-04-23 DIAGNOSIS — F411 Generalized anxiety disorder: Secondary | ICD-10-CM | POA: Diagnosis not present

## 2023-04-24 DIAGNOSIS — Z713 Dietary counseling and surveillance: Secondary | ICD-10-CM | POA: Diagnosis not present

## 2023-04-26 DIAGNOSIS — Z419 Encounter for procedure for purposes other than remedying health state, unspecified: Secondary | ICD-10-CM | POA: Diagnosis not present

## 2023-05-06 DIAGNOSIS — F411 Generalized anxiety disorder: Secondary | ICD-10-CM | POA: Diagnosis not present

## 2023-05-08 DIAGNOSIS — Z713 Dietary counseling and surveillance: Secondary | ICD-10-CM | POA: Diagnosis not present

## 2023-05-13 DIAGNOSIS — F411 Generalized anxiety disorder: Secondary | ICD-10-CM | POA: Diagnosis not present

## 2023-05-19 ENCOUNTER — Telehealth: Payer: Self-pay | Admitting: Podiatry

## 2023-05-19 NOTE — Telephone Encounter (Signed)
 DOS- 07/02/23  LAPIDUS BUNIONECTOMY [id number] METATARSAL OSTEOTOMY 2-3 [id number]   [facility] EFFECTIVE DATE- 03/26/23  PER AN INCOMING FAX FROM [facility], PRIOR AUTH IS NOT REQUIRED FOR CPT CODES [id number] AND [phone].

## 2023-05-20 ENCOUNTER — Ambulatory Visit: Payer: Medicaid Other | Admitting: Podiatry

## 2023-05-20 DIAGNOSIS — F411 Generalized anxiety disorder: Secondary | ICD-10-CM | POA: Diagnosis not present

## 2023-05-22 DIAGNOSIS — Z713 Dietary counseling and surveillance: Secondary | ICD-10-CM | POA: Diagnosis not present

## 2023-05-24 DIAGNOSIS — Z419 Encounter for procedure for purposes other than remedying health state, unspecified: Secondary | ICD-10-CM | POA: Diagnosis not present

## 2023-05-27 DIAGNOSIS — F411 Generalized anxiety disorder: Secondary | ICD-10-CM | POA: Diagnosis not present

## 2023-06-02 DIAGNOSIS — F411 Generalized anxiety disorder: Secondary | ICD-10-CM | POA: Diagnosis not present

## 2023-06-02 DIAGNOSIS — F3181 Bipolar II disorder: Secondary | ICD-10-CM | POA: Diagnosis not present

## 2023-06-02 DIAGNOSIS — F4312 Post-traumatic stress disorder, chronic: Secondary | ICD-10-CM | POA: Diagnosis not present

## 2023-06-02 DIAGNOSIS — F410 Panic disorder [episodic paroxysmal anxiety] without agoraphobia: Secondary | ICD-10-CM | POA: Diagnosis not present

## 2023-06-03 DIAGNOSIS — F411 Generalized anxiety disorder: Secondary | ICD-10-CM | POA: Diagnosis not present

## 2023-06-04 DIAGNOSIS — Z713 Dietary counseling and surveillance: Secondary | ICD-10-CM | POA: Diagnosis not present

## 2023-06-05 ENCOUNTER — Ambulatory Visit (INDEPENDENT_AMBULATORY_CARE_PROVIDER_SITE_OTHER): Payer: Medicaid Other | Admitting: Podiatry

## 2023-06-05 ENCOUNTER — Encounter: Payer: Self-pay | Admitting: Podiatry

## 2023-06-05 DIAGNOSIS — M2011 Hallux valgus (acquired), right foot: Secondary | ICD-10-CM | POA: Diagnosis not present

## 2023-06-05 DIAGNOSIS — M21612 Bunion of left foot: Secondary | ICD-10-CM

## 2023-06-05 DIAGNOSIS — M2012 Hallux valgus (acquired), left foot: Secondary | ICD-10-CM | POA: Diagnosis not present

## 2023-06-05 DIAGNOSIS — Q66229 Congenital metatarsus adductus, unspecified foot: Secondary | ICD-10-CM | POA: Diagnosis not present

## 2023-06-05 DIAGNOSIS — E559 Vitamin D deficiency, unspecified: Secondary | ICD-10-CM

## 2023-06-05 DIAGNOSIS — M21611 Bunion of right foot: Secondary | ICD-10-CM

## 2023-06-08 ENCOUNTER — Encounter: Payer: Self-pay | Admitting: Podiatry

## 2023-06-08 NOTE — Progress Notes (Signed)
  Subjective:  Patient ID: Dana [name], female    DOB: May 11, 1995,  MRN: [id number]  Chief Complaint  Patient presents with   Surgery Scheduling    Patient will like to talk to doctor about getting surgery for bunion on left foot hallux     28 y.o. female presents with the above complaint. History confirmed with patient.  She had been scheduled for bunionectomy with Dr. Annamary Rummage, she has been rescheduled to see me for consultation.  She no longer smokes cigarettes she does vape currently.  No other changes or updates to her medical history.  The bunions been very painful.  Wider shoes have not helped.  She takes anti-inflammatories and this does not help either.  She works on her feet quite a bit.  Objective:  Physical Exam: warm, good capillary refill, no trophic changes or ulcerative lesions, normal DP and PT pulses, normal sensory exam, and bilaterally she has hallux valgus deformity with significant hypermobility at the first TMT.  Large bunion medially.  C-shaped metatarsus adductus foot type   Radiographs: Multiple views x-ray of both feet: Hallux valgus deformity with increased intermetatarsal angle, increased metatarsus adductus angle and hallux abductus angle as well as hallux interphalangeus deformity Assessment:   1. Hallux valgus with bunions, left   2. Hallux valgus with bunions, right   3. Metatarsus adductus, congenital   4. Vitamin D deficiency      Plan:  Patient was evaluated and treated and all questions answered.   Discussed the etiology and treatment including surgical and non surgical treatment for painful bunions.  She has exhausted all non surgical treatment prior to this visit including shoe gear changes and padding.  She desires surgical intervention. We discussed all risks including but not limited to: pain, swelling, infection, scar, numbness which may be temporary or permanent, chronic pain, stiffness, nerve pain or damage, wound healing problems, bone  healing problems including delayed or non-union and recurrence. Specifically we discussed the following procedures: Bunionectomy with metatarsus adductus correction with midfoot fusion and phalangeal osteotomy. Informed consent was signed today. Surgery will be scheduled at a mutually agreeable date. Information regarding this will be forwarded to our surgery scheduler. In the interim until surgery I recommended utilizing as wide of shoes as possible, take NSAIDs or tylenol as tolerated for pain, and a bunion padding shield which can be purchased online.  Vitamin D and calcium level ordered   Surgical plan:  Procedure: -Left foot metatarsus adductus correction with midfoot fusion with osteotomy, Lapidus bunionectomy, Akin osteotomy  Location: -[facility] pending implant approval otherwise we will plan to transition to [facility]  Anesthesia plan: -General With regional block  Postoperative pain plan: - Tylenol 1000 mg every 6 hours, ibuprofen 600 mg every 6 hours, gabapentin 300 mg every 8 hours x5 days, oxycodone 5 mg 1-2 tabs every 6 hours only as needed  DVT prophylaxis: -ASA 325 mg twice daily  WB Restrictions / DME needs: -Nonweightbearing in splint  No follow-ups on file.

## 2023-06-10 DIAGNOSIS — F411 Generalized anxiety disorder: Secondary | ICD-10-CM | POA: Diagnosis not present

## 2023-06-13 ENCOUNTER — Ambulatory Visit (INDEPENDENT_AMBULATORY_CARE_PROVIDER_SITE_OTHER): Payer: BLUE CROSS/BLUE SHIELD | Admitting: Family Medicine

## 2023-06-13 ENCOUNTER — Encounter: Payer: Self-pay | Admitting: Family Medicine

## 2023-06-13 VITALS — BP 112/70 | HR 86 | Temp 98.2°F | Resp 14 | Ht 63.0 in | Wt 144.8 lb

## 2023-06-13 DIAGNOSIS — F419 Anxiety disorder, unspecified: Secondary | ICD-10-CM | POA: Diagnosis not present

## 2023-06-13 DIAGNOSIS — Z01818 Encounter for other preprocedural examination: Secondary | ICD-10-CM

## 2023-06-13 NOTE — Progress Notes (Signed)
 e Subjective:    Dana [name] is a 28 y.o. female who presents to the office today for a preoperative consultation at the request of surgeon McDonald who plans on performing bunionectomy and metatarsus adductus correction on April 18. This consultation is requested for the specific conditions prompting preoperative evaluation (i.e. because of potential affect on operative risk): severe depression. Planned anesthesia: general. The patient has the following known anesthesia issues:  none . Patients bleeding risk: no recent abnormal bleeding. Patient does not have objections to receiving blood products if needed. Discussed the use of AI scribe software for clinical note transcription with the patient, who gave verbal consent to proceed.  History of Present Illness Dana [name] is a 28 year old female who presents for pre-operative evaluation for foot surgery.  She is preparing for surgery to correct a deformity in her left foot, specifically a bunionectomy and metatarsus adductus correction. The surgery was initially scheduled for the ninth but was rescheduled to the eighteenth due to a change in doctors. The right foot will be addressed four months later.  She experiences significant pain, stating she can only be on her feet for about thirty minutes in the morning before the pain becomes excruciating, requiring five to six hours off her feet to alleviate it. She has tried various treatments, including different footwear, spacers, braces, and foot soaks, without relief.  She is currently taking Lamictal and has recently resumed gabapentin at a dose of 100 mg, with ten pills per month, taken as needed for physical anxiety symptoms. Gabapentin has been helpful in managing her symptoms.  She has no history of surgery but has undergone small procedures. There is no family history of anesthesia-related problems. She was adopted and mentioned a past evaluation by a geneticist due to concerns about fetal  alcohol syndrome. She also reports hypermobility symptoms, such as being able to bend her wrists and other joints unusually.  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 Her husband was recently laid off but has been called back to work. Her mother-in-law will assist her post-surgery. No chest pain, shortness of breath, palpitations, or suicidal thoughts. She experiences physical anxiety symptoms.   The following portions of the patient's history were reviewed and updated as appropriate: allergies, current medications, past family history, past medical history, past social history, past surgical history, and problem list.  Review of Systems Review of Systems  Constitutional: Negative for activity change, appetite change and fatigue.  HENT: Negative for hearing loss, congestion, tinnitus and ear discharge.  dentist q35m Eyes: Negative for visual disturbance (see optho q1y -- vision corrected to 20/20 with glasses).  Respiratory: Negative for cough, chest tightness and shortness of breath.   Cardiovascular: Negative for chest pain, palpitations and leg swelling.  Gastrointestinal: Negative for abdominal pain, diarrhea, constipation and abdominal distention.  Genitourinary: Negative for urgency, frequency, decreased urine volume and difficulty urinating.  Musculoskeletal: Negative for back pain, arthralgias and gait problem.  Skin: Negative for color change, pallor and rash.  Neurological: Negative for dizziness, light-headedness, numbness and headaches.  Hematological: Negative for adenopathy. Does not bruise/bleed easily.  Psychiatric/Behavioral: Negative for suicidal ideas, confusion, sleep disturbance, self-injury, dysphoric mood, decreased concentration and agitation.       Objective:    BP 112/70 (BP Location: Left Arm, Patient Position: Sitting, Cuff Size: Normal)   Pulse 86   Temp  98.2 F (36.8 C) (Oral)  Resp 14   Ht 5\' 3"  (1.6 m)   Wt 144 lb 12.8 oz (65.7 kg)   SpO2 98%   BMI 25.65 kg/m  General appearance: alert, cooperative, appears stated age, and no distress Head: Normocephalic, without obvious abnormality, atraumatic Eyes: conjunctivae/corneas clear. PERRL, EOM's intact. Fundi benign. Ears: normal TM's and external ear canals both ears Nose: Nares normal. Septum midline. Mucosa normal. No drainage or sinus tenderness. Throat: lips, mucosa, and tongue normal; teeth and gums normal Neck: no adenopathy, no carotid bruit, no JVD, supple, symmetrical, trachea midline, and thyroid not enlarged, symmetric, no tenderness/mass/nodules Back: symmetric, no curvature. ROM normal. No CVA tenderness. Lungs: clear to auscultation bilaterally Heart: regular rate and rhythm, S1, S2 normal, no murmur, click, rub or gallop Abdomen: soft, non-tender; bowel sounds normal; no masses,  no organomegaly Extremities: extremities normal, atraumatic, no cyanosis or edema Pulses: 2+ and symmetric      Cardiographics ECG: no prior ECG Echocardiogram: not done  Imaging Chest x-ray:  not done    Lab Review  Office Visit on 03/06/2023  Component Date Value   Influenza A, POC 03/06/2023 Negative    Influenza B, POC 03/06/2023 Negative    SARS Coronavirus 2 Ag 03/06/2023 Negative   Office Visit on 01/16/2023  Component Date Value   WBC 01/16/2023 8.1    RBC 01/16/2023 4.54    Hemoglobin 01/16/2023 13.4    HCT 01/16/2023 41.1    MCV 01/16/2023 90.6    MCHC 01/16/2023 32.6    RDW 01/16/2023 12.5    Platelets 01/16/2023 348.0    Neutrophils Relative % 01/16/2023 55.0    Lymphocytes Relative 01/16/2023 35.4    Monocytes Relative 01/16/2023 7.0    Eosinophils Relative 01/16/2023 1.6    Basophils Relative 01/16/2023 1.0    Neutro Abs 01/16/2023 4.4    Lymphs Abs 01/16/2023 2.9    Monocytes Absolute 01/16/2023 0.6    Eosinophils Absolute 01/16/2023 0.1    Basophils  Absolute 01/16/2023 0.1    Sodium 01/16/2023 139    Potassium 01/16/2023 4.4    Chloride 01/16/2023 102    CO2 01/16/2023 28    Glucose, Bld 01/16/2023 86    BUN 01/16/2023 13    Creatinine, Ser 01/16/2023 0.63    Total Bilirubin 01/16/2023 0.4    Alkaline Phosphatase 01/16/2023 67    AST 01/16/2023 16    ALT 01/16/2023 10    Total Protein 01/16/2023 7.4    Albumin 01/16/2023 4.9    GFR 01/16/2023 123.82    Calcium 01/16/2023 9.7    Cholesterol 01/16/2023 210 (H)    Triglycerides 01/16/2023 153.0 (H)    HDL 01/16/2023 49.60    VLDL 01/16/2023 30.6    LDL Cholesterol 01/16/2023 130 (H)    Total CHOL/HDL Ratio 01/16/2023 4    NonHDL 01/16/2023 160.88    T3 Uptake 01/16/2023 28    T4, Total 01/16/2023 6.3    Free Thyroxine Index 01/16/2023 1.8    TSH 01/16/2023 0.89    Magnesium 01/16/2023 2.1    Phosphorus 01/16/2023 4.2    Ferritin 01/16/2023 28.4    VITD 01/16/2023 39.90    Color, UA 01/17/2023 yellow    Clarity, UA 01/17/2023 clear    Glucose, UA 01/17/2023 Negative    Bilirubin, UA 01/17/2023 negative    Ketones, UA 01/17/2023 negative    Spec Grav, UA 01/17/2023 1.010    Blood, UA 01/17/2023 large    pH, UA 01/17/2023 6.0    Protein, UA 01/17/2023 Negative  Urobilinogen, UA 01/17/2023 0.2    Nitrite, UA 01/17/2023 negative    Leukocytes, UA 01/17/2023 Negative       Assessment:      28 y.o. female with planned surgery as above.   Known risk factors for perioperative complications: None   Difficulty with intubation is not anticipated.  Cardiac Risk Estimation: low  Current medications which may produce withdrawal symptoms if withheld perioperatively: per surgical team    Plan:  Assessment and Plan Assessment & Plan Bunion and Metatarsus Adductus   She experiences significant pain from a bunion and metatarsus adductus on the left foot, causing excruciating pain after 30 minutes of activity and requiring several hours of rest for relief. Non-surgical  interventions, including shoe modifications and orthotic devices, have been ineffective. Surgical intervention is planned to correct the deformities, with a bunionectomy and metatarsus adductus correction scheduled for April 18th. The surgery will involve general anesthesia and is expected to last three and a half hours. There is no family history of anesthesia complications, and she has no cardiac history. The decision to proceed with surgery is based on the severity of symptoms and lack of response to conservative measures. Ensure completion of pre-operative lab work, including vitamin D levels. Consider referral to a genetic counselor for potential hypermobility syndrome evaluation.  Anxiety   She experiences physical symptoms of anxiety, which are incapacitating at times. Gabapentin has been reintroduced as an as-needed medication to manage these symptoms, and she reports improvement since resuming the medication. The decision to use gabapentin is based on her previous positive response and current symptomatology. Continue gabapentin 100 mg as needed, up to 10 pills per month.  General Health Maintenance   She is generally healthy and does not require an EKG or additional pre-operative testing beyond the lab work already ordered. The decision to forgo additional testing is based on her low-risk status and absence of cardiac symptoms. Ensure completion of pre-operative lab work.  Follow-up   She is scheduled for surgery on April 18th and has arranged for post-operative care. Ensure she has support for post-operative recovery and encourage follow-up with the surgeon as needed post-operatively.    1. Preoperative workup as follows hemoglobin, hematocrit, electrolytes, creatinine, glucose, liver function studies. 2. Change in medication regimen before surgery:  per surgical team . 3. Prophylaxis for cardiac events with perioperative beta-blockers: not indicated. 4. Invasive hemodynamic monitoring  perioperatively: not indicated. 5. Deep vein thrombosis prophylaxis postoperatively:regimen to be chosen by surgical team. 6. Surveillance for postoperative MI with ECG immediately postoperatively and on postoperative days 1 and 2 AND troponin levels 24 hours postoperatively and on day 4 or hospital discharge (whichever comes first): not indicated. 7. Other measures: n/a  Subjective:  Assessment and Plan Assessment & Plan Bunion and Metatarsus Adductus   She experiences significant pain from a bunion and metatarsus adductus on the left foot, causing excruciating pain after 30 minutes of activity and requiring several hours of rest for relief. Non-surgical interventions, including shoe modifications and orthotic devices, have been ineffective. Surgical intervention is planned to correct the deformities, with a bunionectomy and metatarsus adductus correction scheduled for April 18th. The surgery will involve general anesthesia and is expected to last three and a half hours. There is no family history of anesthesia complications, and she has no cardiac history. The decision to proceed with surgery is based on the severity of symptoms and lack of response to conservative measures. Ensure completion of pre-operative lab work, including vitamin D levels. Consider  referral to a genetic counselor for potential hypermobility syndrome evaluation.  Anxiety   She experiences physical symptoms of anxiety, which are incapacitating at times. Gabapentin has been reintroduced as an as-needed medication to manage these symptoms, and she reports improvement since resuming the medication. The decision to use gabapentin is based on her previous positive response and current symptomatology. Continue gabapentin 100 mg as needed, up to 10 pills per month.  General Health Maintenance   She is generally healthy and does not require an EKG or additional pre-operative testing beyond the lab work already ordered. The decision to  forgo additional testing is based on her low-risk status and absence of cardiac symptoms. Ensure completion of pre-operative lab work.  Follow-up   She is scheduled for surgery on April 18th and has arranged for post-operative care. Ensure she has support for post-operative recovery and encourage follow-up with the surgeon as needed post-operatively.

## 2023-06-17 DIAGNOSIS — F411 Generalized anxiety disorder: Secondary | ICD-10-CM | POA: Diagnosis not present

## 2023-06-24 DIAGNOSIS — F411 Generalized anxiety disorder: Secondary | ICD-10-CM | POA: Diagnosis not present

## 2023-06-27 ENCOUNTER — Encounter: Payer: Self-pay | Admitting: Podiatry

## 2023-06-27 LAB — CALCIUM: Calcium: 9.7 mg/dL (ref 8.7–10.2)

## 2023-06-27 LAB — VITAMIN D 25 HYDROXY (VIT D DEFICIENCY, FRACTURES): Vit D, 25-Hydroxy: 30.7 ng/mL (ref 30.0–100.0)

## 2023-07-01 ENCOUNTER — Encounter: Payer: Self-pay | Admitting: Family Medicine

## 2023-07-01 DIAGNOSIS — M62838 Other muscle spasm: Secondary | ICD-10-CM | POA: Diagnosis not present

## 2023-07-02 ENCOUNTER — Ambulatory Visit: Admit: 2023-07-02 | Payer: BLUE CROSS/BLUE SHIELD | Admitting: Podiatry

## 2023-07-02 DIAGNOSIS — Z713 Dietary counseling and surveillance: Secondary | ICD-10-CM | POA: Diagnosis not present

## 2023-07-02 SURGERY — BUNIONECTOMY, LAPIDUS
Anesthesia: Monitor Anesthesia Care | Laterality: Left

## 2023-07-03 ENCOUNTER — Encounter
Admission: RE | Admit: 2023-07-03 | Discharge: 2023-07-03 | Disposition: A | Discharge status: Home / Self Care | Source: Ambulatory Visit | Attending: Podiatry | Admitting: Podiatry

## 2023-07-03 ENCOUNTER — Telehealth: Payer: Self-pay | Admitting: Podiatry

## 2023-07-03 ENCOUNTER — Other Ambulatory Visit: Payer: Self-pay

## 2023-07-03 DIAGNOSIS — Z01812 Encounter for preprocedural laboratory examination: Secondary | ICD-10-CM

## 2023-07-03 DIAGNOSIS — M2041 Other hammer toe(s) (acquired), right foot: Secondary | ICD-10-CM

## 2023-07-03 HISTORY — DX: Ehlers-Danlos syndrome, unspecified: Q79.60

## 2023-07-03 NOTE — Patient Instructions (Addendum)
 Your procedure is scheduled on: 07/11/2023 Friday Report to the Registration Desk on the 1st floor of the [facility]. To find out your arrival time, please call [phone] between 1PM - 3PM on: 07/10/2023 Thursday If your arrival time is 6:00 am, do not arrive before that time as the [facility] entrance doors do not open until 6:00 am.  REMEMBER: Instructions that are not followed completely may result in serious medical risk, up to and including death; or upon the discretion of your surgeon and anesthesiologist your surgery may need to be rescheduled.  Do not eat food after midnight the night before surgery.  No gum chewing or hard candies.   One week prior to surgery: Stop Anti-inflammatories (NSAIDS) such as Advil, Aleve, Ibuprofen, Motrin, Naproxen, Naprosyn and Aspirin based products such as Excedrin, Goody's Powder, BC Powder. Stop ANY OVER THE COUNTER supplements until after surgery like multivitamin  You may however, continue to take Tylenol if needed for pain up until the day of surgery.  Continue taking all of your other prescription medications up until the day of surgery.  ON THE DAY OF SURGERY ONLY TAKE THESE MEDICATIONS WITH SIPS OF WATER:  gabapentin (NEURONTIN)  lamoTRIgine (LAMICTAL) hydrOXYzine (ATARAX)  Zofran   No Alcohol for 24 hours before or after surgery.  No Smoking including e-cigarettes for 24 hours before surgery.  No chewable tobacco products for at least 6 hours before surgery.  No nicotine patches on the day of surgery.  Do not use any "recreational" drugs for at least a week (preferably 2 weeks) before your surgery.  Please be advised that the combination of cocaine and anesthesia may have negative outcomes, up to and including death. If you test positive for cocaine, your surgery will be cancelled.  On the morning of surgery brush your teeth with toothpaste and water, you may rinse your mouth with mouthwash if you wish. Do not swallow any  toothpaste or mouthwash.  Use CHG Soap or wipes as directed on instruction sheet.- ask pre-op RN for wipes  Do not wear jewelry, make-up, hairpins, clips or nail polish.  Do not wear lotions, powders, or perfumes.   Do not shave body hair from the neck down 48 hours before surgery.  Contact lenses, hearing aids and dentures may not be worn into surgery.  Do not bring valuables to the hospital. [facility] is not responsible for any missing/lost belongings or valuables.   Notify your doctor if there is any change in your medical condition (cold, fever, infection).  Wear comfortable clothing (specific to your surgery type) to the hospital.  After surgery, you can help prevent lung complications by doing breathing exercises.  Take deep breaths and cough every 1-2 hours. Your doctor may order a device called an Incentive Spirometer to help you take deep breaths.   If you are being discharged the day of surgery, you will not be allowed to drive home. You will need a responsible individual to drive you home and stay with you for 24 hours after surgery.    Please call the Pre-admissions Testing Dept. at [phone] if you have any questions about these instructions.  Surgery Visitation Policy:  Patients having surgery or a procedure may have two visitors.  Children under the age of 69 must have an adult with them who is not the patient.    Preparing for Surgery with CHLORHEXIDINE GLUCONATE (CHG) Soap  Chlorhexidine Gluconate (CHG) Soap  o An antiseptic cleaner that kills germs and bonds  with the skin to continue killing germs even after washing  o Used for showering the night before surgery and morning of surgery  Before surgery, you can play an important role by reducing the number of germs on your skin.  CHG (Chlorhexidine gluconate) soap is an antiseptic cleanser which kills germs and bonds with the skin to continue killing germs even after washing.  Please do not use if  you have an allergy to CHG or antibacterial soaps. If your skin becomes reddened/irritated stop using the CHG.  1. Shower the NIGHT BEFORE SURGERY and the MORNING OF SURGERY with CHG soap.  2. If you choose to wash your hair, wash your hair first as usual with your normal shampoo.  3. After shampooing, rinse your hair and body thoroughly to remove the shampoo.  4. Use CHG as you would any other liquid soap. You can apply CHG directly to the skin and wash gently with a scrungie or a clean washcloth.  5. Apply the CHG soap to your body only from the neck down. Do not use on open wounds or open sores. Avoid contact with your eyes, ears, mouth, and genitals (private parts). Wash face and genitals (private parts) with your normal soap.  6. Wash thoroughly, paying special attention to the area where your surgery will be performed.  7. Thoroughly rinse your body with warm water.  8. Do not shower/wash with your normal soap after using and rinsing off the CHG soap.  9. Pat yourself dry with a clean towel.  10. Wear clean pajamas to bed the night before surgery.  12. Place clean sheets on your bed the night of your first shower and do not sleep with pets.  13. Shower again with the CHG soap on the day of surgery prior to arriving at the hospital.  14. Do not apply any deodorants/lotions/powders.  15. Please wear clean clothes to the hospital.

## 2023-07-03 NOTE — Telephone Encounter (Signed)
 DOS: 07/11/23  Quintella Reichert OSTEOTOMY   LAPIDUS PROCEDURE INCLUDING BUNIONECTOMY    EFFECTIVE DATE: 03/26/23  DEDUCTIBLE:  $3,250.00  REMAINING:  $3,250.00  OOP: $6,500.00  REMAINING:  $6,217.62  CO INSURANCE : 0%  PER THE [facility] PROVIDER PORTAL  NO PRIOR AUTH IS REQ FOR CPT CODES [phone]

## 2023-07-05 DIAGNOSIS — Z419 Encounter for procedure for purposes other than remedying health state, unspecified: Secondary | ICD-10-CM | POA: Diagnosis not present

## 2023-07-10 ENCOUNTER — Encounter: Payer: Medicaid Other | Admitting: Podiatry

## 2023-07-10 NOTE — H&P (Signed)
 PREOPERATIVE H&P  Chief Complaint: Painful bunion of left foot  HPI: Dana [name] is a 28 y.o. female who presents for evaluation of <principal problem not specified>. It has been present for several months to years and has been worsening. She has had unsuccessful non operative treatment and presents for surgery today. Pain is rated as moderate. She reports no changes to their PMH or medications.  Past Medical History:  Diagnosis Date   Anxiety    Anxiety disorder of adolescence 06/09/2015   Arm fracture    Asthma    Constipation    alternates with diarrhea   Depression    Diarrhea    alternates with constipation   Ehlers-Danlos syndrome    per patient   GERD (gastroesophageal reflux disease)    Inflammatory bowel disease    Insomnia 06/09/2015   Past Surgical History:  Procedure Laterality Date   NO PAST SURGERIES     Social History   Socioeconomic History   Marital status: Married    Spouse name: Not on file   Number of children: 0   Years of education: 12   Highest education level: Not on file  Occupational History   Occupation: [employer]  Tobacco Use   Smoking status: Former    Current packs/day: 0.25    Average packs/day: 0.3 packs/day for 1 year (0.3 ttl pk-yrs)    Types: Cigarettes   Smokeless tobacco: Current  Vaping Use   Vaping status: Some Days  Substance and Sexual Activity   Alcohol use: No   Drug use: Yes    Types: Marijuana   Sexual activity: Yes    Partners: Male    Birth control/protection: Implant  Other Topics Concern   Not on file  Social History Narrative   Lives with mother, Dana [name]   Caffeine use: 1/day - coffee/tea   Social Drivers of Corporate investment banker Strain: Not on file  Food Insecurity: Not on file  Transportation Needs: Not on file  Physical Activity: Not on file  Stress: Not on file  Social Connections: Unknown (08/07/2021)   Received from [employer]   Social Network    Social Network: Not on file    Family History  Problem Relation Age of Onset   Drug abuse Mother    Depression Mother    Drug abuse Father    Diabetes Maternal Grandmother    Parkinson's disease Maternal Grandmother    Diabetes Maternal Grandfather    Cancer Maternal Grandfather        leukemia   Diabetes Maternal Aunt    Cancer Maternal Aunt        renal   Depression Maternal Aunt    Alcohol abuse Maternal Aunt    Depression Maternal Aunt    Alcohol abuse Maternal Uncle    Depression Maternal Uncle    Coronary artery disease Other    Diabetes Other        paternal side  + hx diabetes   Other Other        Brain Tumor   Stomach cancer Neg Hx    Esophageal cancer Neg Hx    Allergies  Allergen Reactions   Bupropion Hives and Palpitations   Menthol     Burns    Other Itching    MINT Bugs   Prior to Admission medications   Medication Sig Start Date End Date Taking? Authorizing Provider  Cholecalciferol (VITAMIN D -3 PO) Take 2 tablets by mouth daily. Gummy   Yes  [provider]  etonogestrel  (NEXPLANON ) 68 MG IMPL implant 1 each by Subdermal route once.   Yes [provider]  gabapentin  (NEURONTIN ) 100 MG capsule Take 100 mg by mouth daily as needed (Nerve pain). 06/13/23  Yes Roel Clarity R, DO  hydrOXYzine  (ATARAX ) 10 MG tablet Take 20 mg by mouth at bedtime as needed for anxiety.   Yes [provider]  lamoTRIgine (LAMICTAL) 150 MG tablet Take 150 mg by mouth at bedtime.   Yes [provider]  lamoTRIgine (LAMICTAL) 25 MG tablet Take 50 mg by mouth in the morning.   Yes [provider]  Multiple Vitamins-Minerals (MULTIVITAMIN WITH MINERALS) tablet Take 1 tablet by mouth daily.   Yes [provider]  ondansetron  (ZOFRAN ) 4 MG tablet Take 1 tablet (4 mg total) by mouth 2 (two) times daily as needed for nausea or vomiting. 01/16/23  Yes Roel Clarity R, DO  PARoxetine (PAXIL) 30 MG tablet Take 30 mg by mouth at bedtime.   Yes [provider]  prazosin (MINIPRESS) 2 MG capsule Take 2 mg by mouth at bedtime.   Yes [provider]  valACYclovir  (VALTREX ) 1000 MG tablet Take 1 tablet (1,000 mg total) by mouth 2 (two) times daily. Patient taking differently: Take 1,000 mg by mouth 2 (two) times daily as needed (Cold sore). 01/16/23  Yes Roel Clarity R, DO     Positive ROS:    All other systems have been reviewed and were otherwise negative with the exception of those mentioned in the HPI and as above.  Physical Exam: There were no vitals filed for this visit.  General: Alert, no acute distress Cardiovascular: No pedal edema. RRR. No murmurs noted Respiratory: No cyanosis, no use of accessory musculature. Breathing unlabored. Lungs clear, no wheezing GI: No organomegaly, abdomen is soft and non-tender Psychiatric: Patient is competent for consent with normal mood and affect Lymphatic: No axillary or cervical lymphadenopathy   LE Focused Exam: warm, good capillary refill, no trophic changes or ulcerative lesions, normal DP and PT pulses, and normal sensory exam. bunion deformity noted  Assessment/Plan: HALLUX VALGUS WITH BUNIONS, LEFT Plan for Procedure(s): CORRECTION, HALLUX VALGUS, WITH AKIN OSTEOTOMY BUNIONECTOMY, LAPIDUS FUSION, JOINT, FOOT  All risks, benefits and potential complications including but not limited to pain, swelling, infection, scar, numbness which may be temporary or permanent, chronic pain, stiffness, nerve pain or damage, wound healing problems, bone healing problems including delayed or non-union, blood clots, cardiopulmonary complications, morbidity, mortality discussed prior to the procedure. All questions addressed. Informed consent signed and reviewed. She is willing to proceed with surgery today.       [name], DPM 07/11/2023 9:17 AM

## 2023-07-11 ENCOUNTER — Ambulatory Visit

## 2023-07-11 ENCOUNTER — Ambulatory Visit: Admitting: Anesthesiology

## 2023-07-11 ENCOUNTER — Encounter
Admission: RE | Disposition: A | Discharge status: Home / Self Care | Payer: Self-pay | Source: Home / Self Care | Attending: Podiatry

## 2023-07-11 ENCOUNTER — Other Ambulatory Visit: Payer: Self-pay

## 2023-07-11 ENCOUNTER — Encounter: Payer: Self-pay | Admitting: Podiatry

## 2023-07-11 ENCOUNTER — Ambulatory Visit
Admission: RE | Admit: 2023-07-11 | Discharge: 2023-07-11 | Disposition: A | Discharge status: Home / Self Care | Attending: Podiatry | Admitting: Podiatry

## 2023-07-11 DIAGNOSIS — M21612 Bunion of left foot: Secondary | ICD-10-CM | POA: Diagnosis not present

## 2023-07-11 DIAGNOSIS — K219 Gastro-esophageal reflux disease without esophagitis: Secondary | ICD-10-CM | POA: Diagnosis not present

## 2023-07-11 DIAGNOSIS — J45909 Unspecified asthma, uncomplicated: Secondary | ICD-10-CM | POA: Insufficient documentation

## 2023-07-11 DIAGNOSIS — Q796 Ehlers-Danlos syndrome, unspecified: Secondary | ICD-10-CM | POA: Diagnosis not present

## 2023-07-11 DIAGNOSIS — M8970 Major osseous defect, unspecified site: Secondary | ICD-10-CM

## 2023-07-11 DIAGNOSIS — M2012 Hallux valgus (acquired), left foot: Secondary | ICD-10-CM | POA: Diagnosis not present

## 2023-07-11 DIAGNOSIS — G8918 Other acute postprocedural pain: Secondary | ICD-10-CM | POA: Diagnosis not present

## 2023-07-11 DIAGNOSIS — Q66212 Congenital metatarsus primus varus, left foot: Secondary | ICD-10-CM

## 2023-07-11 DIAGNOSIS — F17290 Nicotine dependence, other tobacco product, uncomplicated: Secondary | ICD-10-CM | POA: Insufficient documentation

## 2023-07-11 DIAGNOSIS — Z01812 Encounter for preprocedural laboratory examination: Secondary | ICD-10-CM

## 2023-07-11 DIAGNOSIS — M2041 Other hammer toe(s) (acquired), right foot: Secondary | ICD-10-CM

## 2023-07-11 DIAGNOSIS — Q66222 Congenital metatarsus adductus, left foot: Secondary | ICD-10-CM | POA: Diagnosis not present

## 2023-07-11 HISTORY — PX: HALLUX VALGUS AKIN: SHX6622

## 2023-07-11 HISTORY — PX: HALLUX VALGUS LAPIDUS: SHX6626

## 2023-07-11 HISTORY — PX: FOOT ARTHRODESIS: SHX1655

## 2023-07-11 LAB — POCT PREGNANCY, URINE: Preg Test, Ur: NEGATIVE

## 2023-07-11 SURGERY — CORRECTION, HALLUX VALGUS, WITH AKIN OSTEOTOMY
Anesthesia: Regional | Laterality: Left

## 2023-07-11 MED ORDER — DEXMEDETOMIDINE HCL IN NACL 80 MCG/20ML IV SOLN
INTRAVENOUS | Status: DC | PRN
  Administered 2023-07-11 (×2): 2 ug via INTRAVENOUS
  Administered 2023-07-11: 4 ug via INTRAVENOUS
  Administered 2023-07-11: 2 ug via INTRAVENOUS

## 2023-07-11 MED ORDER — BUPIVACAINE LIPOSOME 1.3 % IJ SUSP
INTRAMUSCULAR | Status: AC
  Filled 2023-07-11: qty 20

## 2023-07-11 MED ORDER — PROPOFOL 500 MG/50ML IV EMUL
INTRAVENOUS | Status: DC | PRN
  Administered 2023-07-11: 125 ug/kg/min via INTRAVENOUS

## 2023-07-11 MED ORDER — IBUPROFEN 600 MG PO TABS: 600.0000 mg | ORAL_TABLET | Freq: Four times a day (QID) | ORAL | 0 refills | Status: AC | PRN

## 2023-07-11 MED ORDER — MIDAZOLAM HCL 2 MG/2ML IJ SOLN
INTRAMUSCULAR | Status: DC | PRN
  Administered 2023-07-11: 2 mg via INTRAVENOUS

## 2023-07-11 MED ORDER — ONDANSETRON HCL 4 MG/2ML IJ SOLN
4.0000 mg | Freq: Once | INTRAMUSCULAR | Status: AC
  Administered 2023-07-11: 4 mg via INTRAVENOUS

## 2023-07-11 MED ORDER — CEFAZOLIN SODIUM-DEXTROSE 2-4 GM/100ML-% IV SOLN
2.0000 g | INTRAVENOUS | Status: AC
  Administered 2023-07-11: 2 g via INTRAVENOUS

## 2023-07-11 MED ORDER — FENTANYL CITRATE PF 50 MCG/ML IJ SOSY
PREFILLED_SYRINGE | INTRAMUSCULAR | Status: AC
Start: 2023-07-11 — End: ?
  Filled 2023-07-11: qty 1

## 2023-07-11 MED ORDER — PROPOFOL 1000 MG/100ML IV EMUL
INTRAVENOUS | Status: AC
  Filled 2023-07-11: qty 100

## 2023-07-11 MED ORDER — CEFAZOLIN SODIUM-DEXTROSE 2-4 GM/100ML-% IV SOLN
INTRAVENOUS | Status: AC
  Filled 2023-07-11: qty 100

## 2023-07-11 MED ORDER — OXYCODONE HCL 5 MG PO TABS: 5.0000 mg | ORAL_TABLET | ORAL | 0 refills | Status: DC | PRN

## 2023-07-11 MED ORDER — FENTANYL CITRATE (PF) 100 MCG/2ML IJ SOLN
INTRAMUSCULAR | Status: DC | PRN
  Administered 2023-07-11 (×2): 25 ug via INTRAVENOUS
  Administered 2023-07-11: 50 ug via INTRAVENOUS

## 2023-07-11 MED ORDER — BUPIVACAINE HCL (PF) 0.5 % IJ SOLN
INTRAMUSCULAR | Status: AC
  Filled 2023-07-11: qty 10

## 2023-07-11 MED ORDER — MIDAZOLAM HCL 2 MG/2ML IJ SOLN
INTRAMUSCULAR | Status: AC
  Filled 2023-07-11: qty 2

## 2023-07-11 MED ORDER — ACETAMINOPHEN 10 MG/ML IV SOLN
INTRAVENOUS | Status: AC
  Filled 2023-07-11: qty 100

## 2023-07-11 MED ORDER — LIDOCAINE HCL (CARDIAC) PF 100 MG/5ML IV SOSY
PREFILLED_SYRINGE | INTRAVENOUS | Status: DC | PRN
  Administered 2023-07-11: 60 mg via INTRAVENOUS

## 2023-07-11 MED ORDER — ONDANSETRON HCL 4 MG/2ML IJ SOLN
INTRAMUSCULAR | Status: AC
  Filled 2023-07-11: qty 2

## 2023-07-11 MED ORDER — FENTANYL CITRATE PF 50 MCG/ML IJ SOSY
50.0000 ug | PREFILLED_SYRINGE | Freq: Once | INTRAMUSCULAR | Status: AC
  Administered 2023-07-11: 50 ug via INTRAVENOUS

## 2023-07-11 MED ORDER — LACTATED RINGERS IV SOLN: INTRAVENOUS | Status: DC

## 2023-07-11 MED ORDER — PROPOFOL 10 MG/ML IV BOLUS
INTRAVENOUS | Status: DC | PRN
  Administered 2023-07-11: 20 mg via INTRAVENOUS
  Administered 2023-07-11: 80 mg via INTRAVENOUS

## 2023-07-11 MED ORDER — BUPIVACAINE LIPOSOME 1.3 % IJ SUSP
INTRAMUSCULAR | Status: DC | PRN
  Administered 2023-07-11: 20 mL

## 2023-07-11 MED ORDER — ONDANSETRON HCL 4 MG/2ML IJ SOLN: 4.0000 mg | Freq: Once | INTRAMUSCULAR | Status: DC | PRN

## 2023-07-11 MED ORDER — MIDAZOLAM HCL 2 MG/2ML IJ SOLN
1.0000 mg | INTRAMUSCULAR | Status: AC | PRN
  Administered 2023-07-11 (×2): 1 mg via INTRAVENOUS

## 2023-07-11 MED ORDER — ACETAMINOPHEN 500 MG PO TABS: 1000.0000 mg | ORAL_TABLET | Freq: Four times a day (QID) | ORAL | 0 refills | Status: AC | PRN

## 2023-07-11 MED ORDER — GABAPENTIN 300 MG PO CAPS: 300.0000 mg | ORAL_CAPSULE | Freq: Three times a day (TID) | ORAL | 0 refills | Status: DC

## 2023-07-11 MED ORDER — FENTANYL CITRATE (PF) 100 MCG/2ML IJ SOLN
INTRAMUSCULAR | Status: AC
  Filled 2023-07-11: qty 2

## 2023-07-11 MED ORDER — BUPIVACAINE HCL (PF) 0.5 % IJ SOLN
INTRAMUSCULAR | Status: DC | PRN
  Administered 2023-07-11: 10 mL

## 2023-07-11 MED ORDER — ASPIRIN 325 MG PO TBEC: 325.0000 mg | DELAYED_RELEASE_TABLET | Freq: Two times a day (BID) | ORAL | 0 refills | Status: AC

## 2023-07-11 SURGICAL SUPPLY — 59 items
BIT DRILL 2 STRT CANN (BIT) IMPLANT
BIT DRILL 2.2 CANN STRGHT (BIT) IMPLANT
BLADE OSC/SAGITTAL MD 5.5X18 (BLADE) IMPLANT
BLADE SAW LAPIPLASTY 40X11 (BLADE) IMPLANT
BLADE SAW SAG MICRO THIN 65D (BLADE) ×1 IMPLANT
BLADE SURG 15 STRL LF DISP TIS (BLADE) ×2 IMPLANT
BLADE SW THK.38XMED LNG THN (BLADE) IMPLANT
BNDG COHESIVE 4X5 TAN STRL LF (GAUZE/BANDAGES/DRESSINGS) ×1 IMPLANT
BNDG ESMARCH 4X12 STRL LF (GAUZE/BANDAGES/DRESSINGS) ×1 IMPLANT
BNDG STRETCH GAUZE 3IN X12FT (GAUZE/BANDAGES/DRESSINGS) ×1 IMPLANT
BUR MIS STRT 2.0X13 (BURR) IMPLANT
BUR MIS STRT 2.0X8 F/DRLSAW (BURR) IMPLANT
BURR MIS STRT 2.0X13 (BURR) ×1 IMPLANT
BURR MIS STRT 2.0X8 F/DRLSAW (BURR) ×1 IMPLANT
CHLORAPREP W/TINT 26 (MISCELLANEOUS) ×1 IMPLANT
CUFF TOURN SGL QUICK 18X4 (TOURNIQUET CUFF) ×1 IMPLANT
DRAPE FLUOR MINI C-ARM 54X84 (DRAPES) ×1 IMPLANT
ELECT REM PT RETURN 9FT ADLT (ELECTROSURGICAL) ×1 IMPLANT
ELECTRODE REM PT RTRN 9FT ADLT (ELECTROSURGICAL) ×1 IMPLANT
GAUZE PAD ABD 8X10 STRL (GAUZE/BANDAGES/DRESSINGS) ×1 IMPLANT
GAUZE SPONGE 4X4 12PLY STRL (GAUZE/BANDAGES/DRESSINGS) ×1 IMPLANT
GLOVE BIOGEL M 7.0 STRL (GLOVE) ×1 IMPLANT
GLOVE BIOGEL PI IND STRL 7.5 (GLOVE) ×1 IMPLANT
GOWN STRL REUS W/ TWL LRG LVL3 (GOWN DISPOSABLE) ×1 IMPLANT
GUIDEWIRE .045XTROC TIP LSR LN (WIRE) IMPLANT
GUIDEWIRE 0.86MM (WIRE) IMPLANT
IMPL SPEEDPLATE MICRO-QUAD 14 (Plate) IMPLANT
IMPL SPEEDPLATE MICRO-QUAD 17 (Plate) IMPLANT
INST FASTGRAFTER HARVEST SYS (INSTRUMENTS) ×1 IMPLANT
INST GUIDED SPEEDRELEASE (INSTRUMENTS) ×1 IMPLANT
INST OSTEOTOME 7 HOOKED (INSTRUMENTS) ×1 IMPLANT
INST TRITOME TRIPLE EDGE REL (INSTRUMENTS) ×1 IMPLANT
INSTRUMENT FASTGRFTR HRVST SYS (INSTRUMENTS) IMPLANT
INSTRUMENT GUIDED SPEEDRELEASE (INSTRUMENTS) IMPLANT
INSTRUMENT OSTEOTOME 7 HOOKED (INSTRUMENTS) IMPLANT
INSTRUMENT TRITOM TRPL EDG REL (INSTRUMENTS) IMPLANT
KIT TURNOVER KIT A (KITS) ×1 IMPLANT
Lapiplasty SpeedPlate ×1 IMPLANT
PACK EXTREMITY ARMC (MISCELLANEOUS) ×1 IMPLANT
PAD PREP OB/GYN DISP 24X41 (PERSONAL CARE ITEMS) ×1 IMPLANT
PENCIL SMOKE EVACUATOR (MISCELLANEOUS) IMPLANT
SCREW CANN FT 3.5X38 (Screw) IMPLANT
SCREW COMPRESSION 2.5X28 (Screw) IMPLANT
SET IRRIGATION TUBING (TUBING) IMPLANT
SLEEVE SCD COMPRESS KNEE MED (STOCKING) ×1 IMPLANT
SPEEDPLATE LAPIPLASTY 18X17 (Plate) ×1 IMPLANT
SPONGE T-LAP 18X18 ~~LOC~~+RFID (SPONGE) IMPLANT
STAPLE BONE SPEEDPLATE 18X17 (Plate) IMPLANT
STOCKINETTE ORTHO 6X25 (MISCELLANEOUS) ×1 IMPLANT
STRIP CLOSURE SKIN 1/2X4 (GAUZE/BANDAGES/DRESSINGS) IMPLANT
SUT DVC V-LOC 4-0 90 CLR P-12 (SUTURE) ×2 IMPLANT
SUT ETHILON 4-0 FS2 18XMFL BLK (SUTURE) IMPLANT
SUT MNCRL AB 3-0 PS2 27 (SUTURE) ×1 IMPLANT
SUT STRATA 4-0 30 PS-2 (SUTURE) IMPLANT
SUTURE DVC V-LC4-0 90 CLR P-12 (SUTURE) IMPLANT
SUTURE ETHLN 4-0 FS2 18XMF BLK (SUTURE) IMPLANT
SYR CONTROL 10ML LL (SYRINGE) ×1 IMPLANT
SpeedPlate Micro Quad 14mm Implant ×1 IMPLANT
SpeedPlate Micro Quad 17mm Implant ×2 IMPLANT

## 2023-07-11 NOTE — Transfer of Care (Signed)
 Immediate Anesthesia Transfer of Care Note  Patient: Subrina L Ashmead  Procedure(s) Performed: CORRECTION, HALLUX VALGUS, WITH AKIN OSTEOTOMY (Left) BUNIONECTOMY, LAPIDUS (Left) FUSION, JOINT, FOOT (Left)  Patient Location: PACU  Anesthesia Type:MAC  Level of Consciousness: awake, alert , and oriented  Airway & Oxygen Therapy: Patient Spontanous Breathing  Post-op Assessment: Report given to RN and Post -op Vital signs reviewed and stable  Post vital signs: stable  Last Vitals:  Vitals Value Taken Time  BP 87/53 07/11/23 1548  Temp    Pulse 66 07/11/23 1550  Resp 20 07/11/23 1550  SpO2 97 % 07/11/23 1550  Vitals shown include unfiled device data.  Last Pain:  Vitals:   07/11/23 0916  TempSrc: Temporal  PainSc: 0-No pain         Complications: No notable events documented.

## 2023-07-11 NOTE — Anesthesia Preprocedure Evaluation (Signed)
 Anesthesia Evaluation  Patient identified by MRN, date of birth, ID band Patient awake    Reviewed: Allergy & Precautions, NPO status , Patient's Chart, lab work & pertinent test results  History of Anesthesia Complications Negative for: history of anesthetic complications  Airway Mallampati: I   Neck ROM: Full    Dental no notable dental hx.    Pulmonary asthma  Current vaping   Pulmonary exam normal breath sounds clear to auscultation       Cardiovascular Exercise Tolerance: Good negative cardio ROS Normal cardiovascular exam Rhythm:Regular Rate:Normal     Neuro/Psych  PSYCHIATRIC DISORDERS Anxiety Depression    Daily marijuana use    GI/Hepatic ,GERD  ,,  Endo/Other  negative endocrine ROS    Renal/GU negative Renal ROS     Musculoskeletal Ehlers Danlos   Abdominal   Peds  Hematology negative hematology ROS (+)   Anesthesia Other Findings   Reproductive/Obstetrics                             Anesthesia Physical Anesthesia Plan  ASA: 2  Anesthesia Plan: General and Regional   Post-op Pain Management: Regional block*   Induction: Intravenous  PONV Risk Score and Plan: 3 and Propofol  infusion, TIVA, Treatment may vary due to age or medical condition and Ondansetron   Airway Management Planned: Natural Airway  Additional Equipment:   Intra-op Plan:   Post-operative Plan:   Informed Consent: I have reviewed the patients History and Physical, chart, labs and discussed the procedure including the risks, benefits and alternatives for the proposed anesthesia with the patient or authorized representative who has indicated his/her understanding and acceptance.       Plan Discussed with: CRNA  Anesthesia Plan Comments: (Plan for preoperative adductor saphenous and popliteal sciatic nerve blocks and GA with natural airway. LMA/GETA backup discussed. Patient consented for risks  of anesthesia including but not limited to:  - adverse reactions to medications - damage to eyes, teeth, lips or other oral mucosa - nerve damage due to positioning  - sore throat or hoarseness - damage to heart, brain, nerves, lungs, other parts of body or loss of life  Informed patient about role of CRNA in peri- and intra-operative care.  Patient voiced understanding.)       Anesthesia Quick Evaluation

## 2023-07-11 NOTE — Anesthesia Postprocedure Evaluation (Signed)
 Anesthesia Post Note  Patient: Dana [name]  Procedure(s) Performed: CORRECTION, HALLUX VALGUS, WITH AKIN OSTEOTOMY (Left) BUNIONECTOMY, LAPIDUS (Left) FUSION, JOINT, FOOT (Left)  Patient location during evaluation: PACU Anesthesia Type: Regional Level of consciousness: awake and alert, oriented and patient cooperative Pain management: pain level controlled Vital Signs Assessment: post-procedure vital signs reviewed and stable Respiratory status: spontaneous breathing, nonlabored ventilation and respiratory function stable Cardiovascular status: blood pressure returned to baseline and stable Postop Assessment: adequate PO intake Anesthetic complications: no   No notable events documented.   Last Vitals:  Vitals:   07/11/23 1600 07/11/23 1615  BP: (!) 100/51 108/66  Pulse: 80 67  Resp: 17 15  Temp:  (!) 36.4 C  SpO2: 98% 100%    Last Pain:  Vitals:   07/11/23 1615  TempSrc:   PainSc: 0-No pain                 Dorothey Gate

## 2023-07-11 NOTE — Brief Op Note (Signed)
 07/11/2023  5:40 PM  PATIENT:  Dana [name]  28 y.o. female  PRE-OPERATIVE DIAGNOSIS:  HALLUX VALGUS WITH BUNIONS, LEFT  POST-OPERATIVE DIAGNOSIS:  HALLUX VALGUS WITH BUNIONS, LEFT  PROCEDURE:  Procedure(s) with comments: CORRECTION, HALLUX VALGUS, WITH AKIN OSTEOTOMY (Left) - GENERAL WITH BLOCK BUNIONECTOMY, LAPIDUS (Left) FUSION, JOINT, FOOT (Left) - MID FOOT FUSION  SURGEON:  Surgeons and Role:    * Moshe Wenger, Juliene SAUNDERS, DPM - Primary  PHYSICIAN ASSISTANT:   ASSISTANTS: none   ANESTHESIA:   regional and general  EBL:  5 mL   BLOOD ADMINISTERED:none  DRAINS: none   LOCAL MEDICATIONS USED:  NONE  SPECIMEN:  No Specimen  DISPOSITION OF SPECIMEN:  N/A  COUNTS:  YES  TOURNIQUET:   Total Tourniquet Time Documented: Calf (Left) - 100 minutes Total: Calf (Left) - 100 minutes   DICTATION: .Note written in EPIC  PLAN OF CARE: Discharge to home after PACU  PATIENT DISPOSITION:  PACU - hemodynamically stable.   Delay start of Pharmacological VTE agent (>24hrs) due to surgical blood loss or risk of bleeding: no

## 2023-07-11 NOTE — Anesthesia Procedure Notes (Signed)
 Anesthesia Regional Block: Adductor canal block   Pre-Anesthetic Checklist: , timeout performed,  Correct Patient, Correct Site, Correct Laterality,  Correct Procedure,, risks and benefits discussed,  Surgical consent,  Pre-op evaluation,  At surgeon's request and post-op pain management  Laterality: Left  Prep: chloraprep       Needles:  Injection technique: Single-shot  Needle Type: Echogenic Needle          Additional Needles:   Procedures:,,,, ultrasound used (permanent image in chart),,   Motor weakness within 20 minutes.  Narrative:  Start time: 07/11/2023 9:57 AM End time: 07/11/2023 9:58 AM Injection made incrementally with aspirations every 5 mL.  Performed by: Personally  Anesthesiologist: Dorothey Gate, MD  Additional Notes: Functioning IV was confirmed and monitors applied.  Sterile prep and drape, hand hygiene and sterile gloves were used. Ultrasound guidance: relevant anatomy identified, needle position confirmed, local anesthetic spread visualized around nerve(s), vascular puncture avoided.  Image saved to electronic medical record.  Negative aspiration prior to incremental administration of local anesthetic for total 5 ml Exparel  and 5 ml bupivacaine  0.5% given in adductor saphenous distribution. The patient tolerated the procedure well. Vital signs and moderate sedation medications recorded in RN notes.

## 2023-07-11 NOTE — Discharge Instructions (Addendum)
 Post-Surgery Instructions  1. If you are recuperating from surgery anywhere other than home, please be sure to leave us  a number where you can be reached. 2. Go directly home and rest. 3. The keep operated foot (or feet) elevated six inches above the hip when sitting or lying down. 4. Support the elevated foot and leg with pillows under the calf. DO NOT PLACE PILLOWS UNDER THE KNEE. 5. DO NOT REMOVE or get your bandages wet. This will increase your chances of getting an infection. 6. Wear your surgical shoe at all times when you are up. 7. A limited amount of pain and swelling may occur. The skin may take on a bruised appearance. This is no cause for alarm. 8. For slight pain and swelling, apply an ice pack directly over the bandage for 15 minutes every hour. Continue icing until seen in the office. DO NOT apply any form of heat to the area. 9. Have prescription(s) filled immediately and take as directed. 10. Drink lots of liquids, water, and juice. 11. CALL THE OFFICE IMMEDIATELY IF: a. Bleeding continues b. Pain increases and/or does not respond to medication c. Bandage or cast appears too tight d. Any liquids (water, coffee, etc.) have spilled on your bandages. e. Tripping, falling, or stubbing the surgical foot f. If your temperature rises above 101 g. If you have ANY questions at all 12. Please use the crutches, knee scooter, or walker you have prescribed, rented, or purchased. If you are non-weight bearing DO NOT put weight on the operated foot for _________ days. If you are weight-bearing, follow your physician's instructions. You are expected to be:   ? non-weight bearing 13. Special Instructions: _____________________________________________________________ _________________________________________________________________________________ _________________________________________________________________________________  14. Your next appointment is: 07/17/2023 1:15 PM   If  you need to reach the nurse for any reason, please call: [place]: [phone] [place]: [phone] [place]: [phone]) [phone]    Peripheral Nerve Block (Lower Extremity) Discharge Instructions    For your surgery you have received a femoral Nerve Block.  Your Nerve Block is expected to last for about 4 to 12 hours.  This is an estimated time frame; the results of your nerve block may wear off sooner or may last longer.  If needed, your surgeon will give you a prescription for pain medication.  It will take about 60 minutes for the oral pain medication to become fully effective.  So, it is recommended that you start taking this medication before the nerve block first begins to wear off, or when you first begin to feel discomfort.  Keep in mind that nerve blocks often wear off in the middle of the night.   If you are going to bed and the block has not started to wear off or you have not started to have any discomfort, consider setting an alarm for 2 to 3 hours, so you can assess your block.  If you notice the block is wearing off or you are starting to have discomfort, you can take your pain medication.  Take your pain medication only as prescribed.  Pain medication can cause sedation and decrease your breathing if you take more than you need for the level of pain that you have.  Nausea is a common side effect of many pain medications.  You may want to eat something before taking your pain medicine to prevent nausea.  After a Peripheral Nerve block, you cannot feel pain, pressure or extremes in temperature in the effected leg.  Because your leg  is numb it is at an increased risk for injury.  To decrease the possibility of injury, please practice the following:  While you are awake change the position of your leg frequently to prevent too much pressure on any one area for prolonged periods of time.   If you have a cast or tight dressing, check the color or your toes every  couple of hours.  Call your surgeon with the appearance of any discoloration (white or blue).  You may have difficulty bearing weight on the effected leg.  Have someone assist you with walking until the nerve block has completely worn off.   If you surgeon prescribed a brace to be worn after surgery, DO NOT GET UP AT NIGHT WITHOUT YOUR BRACE.  If your surgeon has restricted the amount of weight you should bear on the effected leg, i.e. No Weight, Partial Weight, or Touch Down Only, DO NOT BEAR MORE WEIGHT THAN INSTRUCTED.   If you experience any problems or concerns, please contact your surgeon.

## 2023-07-11 NOTE — Anesthesia Procedure Notes (Signed)
 Anesthesia Regional Block: Popliteal block   Pre-Anesthetic Checklist: , timeout performed,  Correct Patient, Correct Site, Correct Laterality,  Correct Procedure,, risks and benefits discussed,  Surgical consent,  Pre-op evaluation,  At surgeon's request and post-op pain management  Laterality: Left  Prep: chloraprep       Needles:  Injection technique: Single-shot  Needle Type: Echogenic Needle          Additional Needles:   Procedures:,,,, ultrasound used (permanent image in chart),,   Motor weakness within 20 minutes.  Narrative:  Start time: 07/11/2023 9:55 AM End time: 07/11/2023 9:56 AM Injection made incrementally with aspirations every 5 mL.  Performed by: Personally  Anesthesiologist: Dorothey Gate, MD  Additional Notes: Functioning IV was confirmed and monitors applied.  Sterile prep and drape, hand hygiene and sterile gloves were used. Ultrasound guidance: relevant anatomy identified, needle position confirmed, local anesthetic spread visualized around nerve(s), vascular puncture avoided.  Image saved to electronic medical record.  Negative aspiration prior to incremental administration of local anesthetic for total 15 ml Exparel  and 5 ml bupivacaine  0.5% given in popliteal sciatic distribution. The patient tolerated the procedure well. Vital signs and moderate sedation medications recorded in RN notes.

## 2023-07-12 ENCOUNTER — Encounter: Payer: Self-pay | Admitting: Podiatry

## 2023-07-12 ENCOUNTER — Other Ambulatory Visit: Payer: Self-pay | Admitting: Podiatry

## 2023-07-12 NOTE — Progress Notes (Signed)
 Patient called Answering Service with her husband and is in severe pain block has worn off. She can move her toes and feel the toes. advised her to try to loosen the ace wrap on the splint to see if this helps. She says it's too painful to touch it. She is taking Tylenol , ibuprofen  and gabapentin  consistently around the clock.  I advised her that She can decrease the frequency and dose of the oxycodone  to 10 mg every four hours and they will do this and let me know if they need a refill of it.

## 2023-07-13 DIAGNOSIS — M8970 Major osseous defect, unspecified site: Secondary | ICD-10-CM

## 2023-07-13 DIAGNOSIS — Q66222 Congenital metatarsus adductus, left foot: Secondary | ICD-10-CM

## 2023-07-13 DIAGNOSIS — Q66212 Congenital metatarsus primus varus, left foot: Secondary | ICD-10-CM

## 2023-07-13 DIAGNOSIS — M2012 Hallux valgus (acquired), left foot: Secondary | ICD-10-CM

## 2023-07-13 NOTE — Op Note (Signed)
 Patient Name: Dana [name] DOB: 23-Dec-1995  MRN: [id number]   Date of Service: 07/11/2023  Surgeon: Dr. Juliene Medicine, DPM Assistants: None Pre-operative Diagnosis:  HALLUX VALGUS WITH BUNIONS, LEFT METATARSUS ADDUCTUS LEFT  Post-operative Diagnosis:  HALLUX VALGUS WITH BUNIONS, LEFT METATARSUS ADDUCTUS LEFT BONY DEFECT HALLUX INTERPHALANGEUS LET Procedures: Lapidus Bunionectomy Correction metatarsus adductus with 2nd and 3rd TMT fusion with osteotomy Akin phalangeal osteotomy Pathology/Specimens: * No specimens in log * Anesthesia: General and regional Hemostasis: 120 minutes around the calf Estimated Blood Loss: 5 mL Materials:  Implant Name Type Inv. Item Serial No. Manufacturer Lot No. LRB No. Used Action  SpeedPlate Micro Quad 17mm Implant    [facility] MEDICAL [id number] Left 2 Implanted  Darnella Fogo    [facility] MEDICAL [id number] Left 1 Implanted  SpeedPlate Micro Quad 14mm Implant    [facility] MEDICAL [id number] Left 1 Implanted  SCREW CANN FT 3.5X38 - [id number] Screw SCREW CANN FT 3.5X38  [employer]  Left 1 Implanted  SCREW COMPRESSION 2.5X28 - [id number] Screw SCREW COMPRESSION 2.5X28  [employer]  Left 1 Implanted   Medications: none Complications: none  Indications for Procedure:  This is a 28 y.o. female with a history of hallux valgus deformity and metatarsus adductus of the left foot.  She failed nonoperative treatment and opted for elective surgical treatment. All risks, benefits and potential complications discussed prior to the procedure. All questions addressed. Informed consent signed and reviewed.      Procedure in Detail: Patient was identified in pre-operative holding area. Formal consent was signed and the left lower extremity was marked. Patient was brought back to the operating room. Anesthesia was induced. The extremity was prepped and draped in the usual sterile fashion. Timeout was taken to confirm patient name, laterality, and procedure  prior to incision.   Attention was then directed to the left foot where a dorsal longitudinal incision was made over the first tarsometatarsal joint. This was placed medial to the extensor hallucis longus tendon. Dissection was carried through subcutaneous tissues, ensuring that all vital neurovascular structures were protected throughout the procedure. Bleeders were cauterized as necessary. The deep fascia and periosteum was incised, dissection was carried to bone and the extensor hallucis longus tendon within its sheath and soft tissues were retracted laterally. The periosteum was reflected in the first tarsometatarsal joint capsule and ligaments were incised and the joint was exposed. A sagittal saw and osteotome was used to free the plantar soft tissue attachments of the joint to mobilize the first ray.  A small incision was made in the first interspace. Under fluoroscopy, the lateral capsule and sesamoidal suspensory ligament were then released using the Speed Release tool while a varus maneuver was made on the hallux with good release of the sesamoid complex. Good mobility of the first ray was noted.  I then directed my attention to the lateral midfoot where fluoroscopy was used to locate the axis of the 3rd metatarsal. A longitudinal incision was made centered over this axis with the tarsometatarsal joint as the mid point. Dissection was carried through subcutaneous tissue, the intermediate cutaneous nerve was  identified and safely retraced. The EDB and EHB muscle bellies  were identified and divided. Dissection was carried down to the level of the 3rd metatarsal and lateral cuneiform. A tritome tool was used to free the dorsal and plantar soft tissue  capsule of  the 2nd and 3rd TMT  joints. The intermetatarsal ligament between the 3rd and 4th metatarsal bases was also released. Care  was taken to maintain the 2-3 IM ligament. The cut guide and saw was  used to plane the 2-3 TMT  joints and the keel of  the angular cut guide was inserted and assessed under fluoroscopy. The fulcrum was placed  between the 3-4  metatarsal bases to protect the 4th metatarsal. We determined the standard sized 6 degree cut was sufficient and the cuts were made. The bone and cartilage of the 2nd and 3rd TMT joints were excised with osteotome and rongeur and irrigated thoroughly. The remaining bone was then fenestrated using a drill. I then directed my attention to the lateral heel where a 1 cm incision was made 2 cm anterior to the posterior border of the heel and 2 cm proximal to the glabrous junction. A hemostat and freer elevator was used to elevate the soft tissues off the lateral wall the calcaneus. The bone graft reamer was then used to procure 1 to 2 cc of autogenous cancellous bone graft. This bone graft was taken and placed into the arthrodesis site of the tarsometatarsal joints. The calcaneal incision was then irrigated and closed with 4-0 nylon. The osteotomy was then manually reduced and compressed with the compressor and Steinmann pins. Final fixation with SpeedPlates were placed on the dorsal 3rd and 2nd TMT arthrodesis sites and affixed accordingly to hold the compression, with fluoroscopy used throughout.  I then returned to the 1st tarsometatarsal joint and the fulcrum was placed into the lateral base of the first metatarsal. Dissection was carried plantar medial and the medial ridge of the first metatarsal was exposed. The jig was then placed onto the ridge and the intermetatarsal angle was reduced with engagement of the windlass mechanism and varus rotation of the first metatarsal to correct the deformity. This was done under fluoroscopy. Once adequate correction was obtained a temporary fixation wire was placed through the jig. 2 dorsal Steinmann pins were then placed into the base of the first metatarsal and medial cuneiform. The joint seeker and fulcrum were then placed into the joint once more and the cut guide  was placed over the Steinmann pins. The base of the first metatarsal and distal cartilage and articular surface and subchondral bone of the medial cuneiform was then resected using a sagittal saw through the cut guide. The subchondral bone plate and cartilage was then removed.  The underlying bone was fenestrated with the drill. The bone graft was then inset here as well.  The compression jig was then placed over the Steinmann pins and the arthrodesis site was compressed under manual tension with the correction maintained with the reduction jig. Once adequate compression and maintenance of correction of the deformity was noted under fluoroscopy it was then temporarily fixated using olive wires. The compression jig was then removed and the dorsal Speed Plate guide was put into position. This was then temporarily fixated and sequentially drilled and then the plate was placed. I then exposed the medial surface of the arthrodesis site, contoured the bone to fit and placed the medial plate orthogonal to the dorsal plate. All positions were checked with fluoroscopy.   The first intermetatarsal space splay was then evaluated, this had diastasis at the intercuneiform joint. A guidewire for a 3.63mm FT screw was then placed from the dorsal medial base of the metatarsal into the lateral portion of the intermediate cuneiform. The screw was measured, drilled, and inserted with manual compression across the intermetatarsal space with good stability noted. The test was then repeated and was negative.  This corrected the midfoot and metatarsal deformity however some hallux interphalangeal deformity remained. Attention was then directed to the proximal phalanx of the hallux where a small medial incision was made midline at the proximal phalanx, periosteal elevator then used to elevate the soft tissue dorsally and plantarly. The bur was then inserted midline positioned medial to lateral. The bur was advanced, leaving the  lateral cortex intact, creating the osteotomy with the lateral hinge. The osteotomy was then manually reduced and a K wire was inserted to prepare for fixation. Positioning of the K wire distal lateral to proximal medial crossing the osteotomy site. Small incision was made at the proximal medial aspect of the hallux base to prepare for insertion of the screw. Next measurement and drilling for the [employer] 2.60mm screw was performed followed by placement of the screw across the osteotomy site. I manually held compression and advanced the screw. The osteotomy was checked under fluoroscopy and appeared to be stable.   Final films were taken.  All incisions were then irrigated.  They were closed in layers with 3-0 Monocryl 4-0 nylon and 4-0 V lock and Steri-Strips  The foot was then dressed with Xeroform dry sterile dressings Ace wrap and a well-padded below the knee posterior splint. Patient tolerated the procedure well.   Disposition: Following a period of post-operative monitoring, patient will be transferred to home.

## 2023-07-14 ENCOUNTER — Encounter: Payer: Self-pay | Admitting: Podiatry

## 2023-07-14 MED ORDER — OXYCODONE HCL 10 MG PO TABS: 10.0000 mg | ORAL_TABLET | ORAL | 0 refills | Status: AC | PRN

## 2023-07-15 ENCOUNTER — Encounter: Payer: Self-pay | Admitting: Podiatry

## 2023-07-15 DIAGNOSIS — Z713 Dietary counseling and surveillance: Secondary | ICD-10-CM | POA: Diagnosis not present

## 2023-07-17 ENCOUNTER — Encounter: Payer: Self-pay | Admitting: Podiatry

## 2023-07-17 ENCOUNTER — Ambulatory Visit: Payer: Self-pay | Admitting: Podiatry

## 2023-07-17 ENCOUNTER — Ambulatory Visit (INDEPENDENT_AMBULATORY_CARE_PROVIDER_SITE_OTHER)

## 2023-07-17 VITALS — Ht 63.0 in | Wt 145.0 lb

## 2023-07-17 DIAGNOSIS — M21612 Bunion of left foot: Secondary | ICD-10-CM

## 2023-07-17 DIAGNOSIS — M2012 Hallux valgus (acquired), left foot: Secondary | ICD-10-CM

## 2023-07-17 DIAGNOSIS — Q66229 Congenital metatarsus adductus, unspecified foot: Secondary | ICD-10-CM

## 2023-07-17 NOTE — Progress Notes (Unsigned)
  Subjective:  Patient ID: Dana [name], female    DOB: [birth year],  MRN: [id number]  Chief Complaint  Patient presents with   Routine Post Op    Pt is here for 1st post op visit since surgery on Friday, states she is in a lot of pain.    DOS: 07/11/2023 Procedure: Correction hallux valgus left foot with metatarsus adductus correction and Akin osteotomy  28 y.o. female returns for post-op check.  Having significant pain has been difficult to control so far for her the nerve block only lasted a few hours  Review of Systems: Negative except as noted in the HPI. Denies N/V/F/Ch.   Objective:  There were no vitals filed for this visit. Body mass index is 25.69 kg/m. Constitutional Well developed. Well nourished.  Vascular Foot warm and well perfused. Capillary refill normal to all digits.  Calf is soft and supple, no posterior calf or knee pain, negative Homans' sign  Neurologic Normal speech. Oriented to person, place, and time. Epicritic sensation to light touch grossly present bilaterally.  Dermatologic Skin healing well without signs of infection. Skin edges well coapted without signs of infection.  Orthopedic: Tenderness to palpation noted about the surgical site.  Moderate edema   Multiple view plain film radiographs: Good correction noted all implants and correction equivalent to immediate postoperative films Assessment:   1. Hallux valgus with bunions, left   2. Metatarsus adductus, congenital    Plan:  Patient was evaluated and treated and all questions answered.  S/p foot surgery left -Progressing as expected post-operatively. -XR: Noted above no issues -WB Status: Nonweightbearing in CAM Walker boot.  Dispensed today.  Encouraged her to remove the boot every hour move the ankle up and down the left and right as well as range of motion of the digits manually and actively.  Okay to use boot to transfer on heel but do not want her walking -Sutures: Return in 2 weeks  to remove. -Medications: Still taking Tylenol  ibuprofen  and gabapentin .  Rx for oxycodone  10 has been sent to pharmacy due to higher demand for pain control.  We are working to see if there is a prior authorization needed for this.  Otherwise we will refill her 5 mg and she will take 2 -Foot redressed.  No follow-ups on file.

## 2023-07-22 ENCOUNTER — Encounter: Payer: Self-pay | Admitting: Podiatry

## 2023-07-22 DIAGNOSIS — F411 Generalized anxiety disorder: Secondary | ICD-10-CM | POA: Diagnosis not present

## 2023-07-28 DIAGNOSIS — F410 Panic disorder [episodic paroxysmal anxiety] without agoraphobia: Secondary | ICD-10-CM | POA: Diagnosis not present

## 2023-07-28 DIAGNOSIS — F3181 Bipolar II disorder: Secondary | ICD-10-CM | POA: Diagnosis not present

## 2023-07-28 DIAGNOSIS — F4312 Post-traumatic stress disorder, chronic: Secondary | ICD-10-CM | POA: Diagnosis not present

## 2023-07-28 DIAGNOSIS — F411 Generalized anxiety disorder: Secondary | ICD-10-CM | POA: Diagnosis not present

## 2023-07-29 DIAGNOSIS — F411 Generalized anxiety disorder: Secondary | ICD-10-CM | POA: Diagnosis not present

## 2023-07-31 ENCOUNTER — Encounter: Payer: Self-pay | Admitting: Podiatry

## 2023-07-31 ENCOUNTER — Ambulatory Visit (INDEPENDENT_AMBULATORY_CARE_PROVIDER_SITE_OTHER): Payer: Self-pay | Admitting: Podiatry

## 2023-07-31 ENCOUNTER — Ambulatory Visit (INDEPENDENT_AMBULATORY_CARE_PROVIDER_SITE_OTHER)

## 2023-07-31 DIAGNOSIS — M21612 Bunion of left foot: Secondary | ICD-10-CM

## 2023-07-31 DIAGNOSIS — M2012 Hallux valgus (acquired), left foot: Secondary | ICD-10-CM | POA: Diagnosis not present

## 2023-07-31 DIAGNOSIS — Q66229 Congenital metatarsus adductus, unspecified foot: Secondary | ICD-10-CM

## 2023-07-31 DIAGNOSIS — Z713 Dietary counseling and surveillance: Secondary | ICD-10-CM | POA: Diagnosis not present

## 2023-07-31 NOTE — Progress Notes (Signed)
  Subjective:  Patient ID: Dana [name], female    DOB: 1995-11-26,  MRN: [id number]  Chief Complaint  Patient presents with   Routine Post Op    POV # 2 DOS 07/11/23 LT FT BUNION CORRECTION AND MIDFOOT FUSION, BONE CUT IN GREAT TOE LT.    DOS: 07/11/2023 Procedure: Correction hallux valgus left foot with metatarsus adductus correction and Akin osteotomy  28 y.o. female returns for post-op check.  Doing well pain is improved quite a bit she slipped and put her weight down left foot and it hurt for a little bit but is improving  Review of Systems: Negative except as noted in the HPI. Denies N/V/F/Ch.   Objective:  There were no vitals filed for this visit. There is no height or weight on file to calculate BMI. Constitutional Well developed. Well nourished.  Vascular Foot warm and well perfused. Capillary refill normal to all digits.  Calf is soft and supple, no posterior calf or knee pain, negative Homans' sign  Neurologic Normal speech. Oriented to person, place, and time. Epicritic sensation to light touch grossly present bilaterally.  Dermatologic Her incision is well-healed and not hypertrophic  Orthopedic: Only mild edema.  Pain is improving.  Good range of motion of MTP joint   Multiple view plain film radiographs: Correction is maintained without complication Assessment:   1. Hallux valgus with bunions, left   2. Metatarsus adductus, congenital    Plan:  Patient was evaluated and treated and all questions answered.  S/p foot surgery left - Doing much better is back to Tylenol  and Motrin  at this point.  May begin gradual weightbearing on the heel using crutches over the next couple weeks and then heel-to-toe weightbearing.  I will see her in 3 weeks we hopefully should build to transition back to a surgical shoe at that point.  Continue range of motion of digits specially the first MTP joint.  No follow-ups on file.

## 2023-08-04 DIAGNOSIS — Z419 Encounter for procedure for purposes other than remedying health state, unspecified: Secondary | ICD-10-CM | POA: Diagnosis not present

## 2023-08-05 DIAGNOSIS — F411 Generalized anxiety disorder: Secondary | ICD-10-CM | POA: Diagnosis not present

## 2023-08-13 DIAGNOSIS — F4312 Post-traumatic stress disorder, chronic: Secondary | ICD-10-CM | POA: Diagnosis not present

## 2023-08-13 DIAGNOSIS — F3181 Bipolar II disorder: Secondary | ICD-10-CM | POA: Diagnosis not present

## 2023-08-13 DIAGNOSIS — F411 Generalized anxiety disorder: Secondary | ICD-10-CM | POA: Diagnosis not present

## 2023-08-13 DIAGNOSIS — F410 Panic disorder [episodic paroxysmal anxiety] without agoraphobia: Secondary | ICD-10-CM | POA: Diagnosis not present

## 2023-08-14 DIAGNOSIS — Z713 Dietary counseling and surveillance: Secondary | ICD-10-CM | POA: Diagnosis not present

## 2023-08-19 DIAGNOSIS — F411 Generalized anxiety disorder: Secondary | ICD-10-CM | POA: Diagnosis not present

## 2023-08-21 ENCOUNTER — Ambulatory Visit (INDEPENDENT_AMBULATORY_CARE_PROVIDER_SITE_OTHER): Payer: Self-pay | Admitting: Podiatry

## 2023-08-21 ENCOUNTER — Ambulatory Visit (INDEPENDENT_AMBULATORY_CARE_PROVIDER_SITE_OTHER)

## 2023-08-21 DIAGNOSIS — Q66229 Congenital metatarsus adductus, unspecified foot: Secondary | ICD-10-CM

## 2023-08-21 DIAGNOSIS — M21612 Bunion of left foot: Secondary | ICD-10-CM | POA: Diagnosis not present

## 2023-08-21 DIAGNOSIS — M2012 Hallux valgus (acquired), left foot: Secondary | ICD-10-CM

## 2023-08-23 NOTE — Progress Notes (Signed)
  Subjective:  Patient ID: Dana [name], female    DOB: March 10, 1996,  MRN: [id number]  Chief Complaint  Patient presents with   Routine Post Op    POV # 3 DOS 07/11/23 LT FT BUNION CORRECTION AND MIDFOOT FUSION, BONE CUT IN GREAT TOE LT Pt stated that she is doing well she does have some discomfort by the end of the day     DOS: 07/11/2023 Procedure: Correction hallux valgus left foot with metatarsus adductus correction and Akin osteotomy  28 y.o. female returns for post-op check.  Doing well   Review of Systems: Negative except as noted in the HPI. Denies N/V/F/Ch.   Objective:  There were no vitals filed for this visit. There is no height or weight on file to calculate BMI. Constitutional Well developed. Well nourished.  Vascular Foot warm and well perfused. Capillary refill normal to all digits.  Calf is soft and supple, no posterior calf or knee pain, negative Homans' sign  Neurologic Normal speech. Oriented to person, place, and time. Epicritic sensation to light touch grossly present bilaterally.  Dermatologic Her incision is well-healed and not hypertrophic small area of suture abscess proximally  Orthopedic: Only minimal edema.  Pain is minimal.  Good range of motion of MTP joint   Multiple view plain film radiographs: Correction is maintained without complication Assessment:   1. Hallux valgus with bunions, left   2. Metatarsus adductus, congenital    Plan:  Patient was evaluated and treated and all questions answered.  S/p foot surgery left - Doing very well.  Continue range of motion.  Transition to surgical shoe which I dispensed today.  In 2 to 3 weeks may transition back to regular shoe gear as tolerated.  Return in about 6 weeks (around 10/02/2023) for surgery follow up with new xrays left foot.

## 2023-08-26 DIAGNOSIS — F411 Generalized anxiety disorder: Secondary | ICD-10-CM | POA: Diagnosis not present

## 2023-08-28 DIAGNOSIS — K036 Deposits [accretions] on teeth: Secondary | ICD-10-CM | POA: Diagnosis not present

## 2023-08-28 DIAGNOSIS — Z713 Dietary counseling and surveillance: Secondary | ICD-10-CM | POA: Diagnosis not present

## 2023-09-02 DIAGNOSIS — F411 Generalized anxiety disorder: Secondary | ICD-10-CM | POA: Diagnosis not present

## 2023-09-04 DIAGNOSIS — Z419 Encounter for procedure for purposes other than remedying health state, unspecified: Secondary | ICD-10-CM | POA: Diagnosis not present

## 2023-09-09 DIAGNOSIS — F411 Generalized anxiety disorder: Secondary | ICD-10-CM | POA: Diagnosis not present

## 2023-09-11 DIAGNOSIS — Z713 Dietary counseling and surveillance: Secondary | ICD-10-CM | POA: Diagnosis not present

## 2023-09-16 DIAGNOSIS — F411 Generalized anxiety disorder: Secondary | ICD-10-CM | POA: Diagnosis not present

## 2023-09-23 DIAGNOSIS — F411 Generalized anxiety disorder: Secondary | ICD-10-CM | POA: Diagnosis not present

## 2023-09-25 ENCOUNTER — Encounter: Payer: Self-pay | Admitting: Podiatry

## 2023-09-25 ENCOUNTER — Ambulatory Visit: Admitting: Podiatry

## 2023-09-25 ENCOUNTER — Ambulatory Visit (INDEPENDENT_AMBULATORY_CARE_PROVIDER_SITE_OTHER)

## 2023-09-25 DIAGNOSIS — Z713 Dietary counseling and surveillance: Secondary | ICD-10-CM | POA: Diagnosis not present

## 2023-09-25 DIAGNOSIS — M2012 Hallux valgus (acquired), left foot: Secondary | ICD-10-CM | POA: Diagnosis not present

## 2023-09-25 DIAGNOSIS — M21612 Bunion of left foot: Secondary | ICD-10-CM | POA: Diagnosis not present

## 2023-09-25 DIAGNOSIS — Q66229 Congenital metatarsus adductus, unspecified foot: Secondary | ICD-10-CM

## 2023-09-25 DIAGNOSIS — M21611 Bunion of right foot: Secondary | ICD-10-CM

## 2023-09-25 DIAGNOSIS — M2011 Hallux valgus (acquired), right foot: Secondary | ICD-10-CM

## 2023-09-25 NOTE — Addendum Note (Signed)
 Addended byBETHA MEDICINE, Oris Staffieri R on: 09/25/2023 09:13 PM   Modules accepted: Level of Service

## 2023-09-25 NOTE — Progress Notes (Signed)
 Subjective:  Patient ID: Dana [name], female    DOB: 1995-08-09,  MRN: [id number]  Chief Complaint  Patient presents with   Routine Post Op    Rm22 patient says that she is having pain in foot that radiates to ankle.    DOS: 07/11/2023 Procedure: Correction hallux valgus left foot with metatarsus adductus correction and Akin osteotomy  28 y.o. female returns for post-op check.  Doing well she notes some difficulty getting back to regular shoe gear and walking and feels tightness in the front of the ankle.   She would like to plan for surgery on her right foot for the fall now.  Review of Systems: Negative except as noted in the HPI. Denies N/V/F/Ch.   Objective:  There were no vitals filed for this visit. There is no height or weight on file to calculate BMI. Constitutional Well developed. Well nourished.  Vascular Foot warm and well perfused. Capillary refill normal to all digits.  Calf is soft and supple, no posterior calf or knee pain, negative Homans' sign  Neurologic Normal speech. Oriented to person, place, and time. Epicritic sensation to light touch grossly present bilaterally.  Dermatologic Incision is well-healed not hypertrophic  Orthopedic: Minimal edema in forefoot.  Good range of motion of MTP joint.  Mild tenderness on talonavicular and anterior ankle.  Hallux valgus and metatarsus adductus deformity on the right foot.   Multiple view plain film radiographs: Correction is maintained without complication, no bony abnormality in the area of the hindfoot  Her right foot radiographs reviewed today previously taken shows hallux valgus Forni metatarsus adductus deformity Assessment:   1. Hallux valgus with bunions, left   2. Metatarsus adductus, congenital   3. Hallux valgus with bunions, right    Plan:  Patient was evaluated and treated and all questions answered.  S/p foot surgery left - Overall doing well we reviewed her x-rays and discussed the significant  correction of her foot deformity and this likely is going to take some time to get used to.  I recommended physical therapy for gait training and ambulation.  No restrictions on activity.  Regular shoe gear as tolerated.  PT referral sent to [facility]   Right foot hallux valgus deformity and metatarsus adductus  We reviewed her progression with her left foot.  She has done well with correction here.  She would like to proceed with surgery on the right foot.  She has similar deformity and I recommended the same surgical plan for this as well.  Surgery we discussed Lapidus bunionectomy, metatarsus adductus deformity correction with midfoot fusion and osteotomy and Aiken osteotomy as well as possible bone graft.  Discussed the risk benefits and potential complications of this.  All questions addressed.  Informed consent signed reviewed.  Surgery will be scheduled for this fall.   Surgical plan:  Procedure: - Right foot Lapidus bunionectomy, metatarsal adductus correction with osteotomy and midfoot fusion, Akin osteotomy, possible bone graft  Location: - [facility]  Anesthesia plan: - General With regional block  Postoperative pain plan: - Tylenol  1000 mg every 6 hours, ibuprofen  600 mg every 6 hours, gabapentin  300 mg every 8 hours x5 days, oxycodone  5 mg 1-2 tabs every 6 hours only as needed  DVT prophylaxis: - Aspirin  325 mg twice daily  WB Restrictions / DME needs: - Nonweightbearing in splint postop with knee scooter.    Return in about 8 weeks (around 11/20/2023) for Follow-up left foot surgery and PT, right foot surgery preop  visit (bilateral foot x-rays).

## 2023-10-02 ENCOUNTER — Encounter: Admitting: Podiatry

## 2023-10-04 DIAGNOSIS — Z419 Encounter for procedure for purposes other than remedying health state, unspecified: Secondary | ICD-10-CM | POA: Diagnosis not present

## 2023-10-06 DIAGNOSIS — F411 Generalized anxiety disorder: Secondary | ICD-10-CM | POA: Diagnosis not present

## 2023-10-08 ENCOUNTER — Telehealth: Payer: Self-pay | Admitting: Podiatry

## 2023-10-08 NOTE — Telephone Encounter (Signed)
 Received surgical consent forms.  Lvm for pt to call to get her surgery scheduled.

## 2023-10-09 DIAGNOSIS — F3181 Bipolar II disorder: Secondary | ICD-10-CM | POA: Diagnosis not present

## 2023-10-09 DIAGNOSIS — F4312 Post-traumatic stress disorder, chronic: Secondary | ICD-10-CM | POA: Diagnosis not present

## 2023-10-09 DIAGNOSIS — F411 Generalized anxiety disorder: Secondary | ICD-10-CM | POA: Diagnosis not present

## 2023-10-13 DIAGNOSIS — F411 Generalized anxiety disorder: Secondary | ICD-10-CM | POA: Diagnosis not present

## 2023-10-13 DIAGNOSIS — Z713 Dietary counseling and surveillance: Secondary | ICD-10-CM | POA: Diagnosis not present

## 2023-10-16 ENCOUNTER — Ambulatory Visit: Admitting: Physical Therapy

## 2023-10-20 DIAGNOSIS — F411 Generalized anxiety disorder: Secondary | ICD-10-CM | POA: Diagnosis not present

## 2023-10-24 NOTE — Therapy (Signed)
 OUTPATIENT PHYSICAL THERAPY LOWER EXTREMITY EVALUATION   Patient Name: AALINA BREGE MRN: [id number] DOB:1996/02/16, 28 y.o., female Today's Date: 10/27/2023   END OF SESSION:  PT End of Session - 10/27/23 1614     Visit Number 1    Date for PT Re-Evaluation 01/05/24    Authorization Type [employer]    Authorization Time Period Auth pending    Authorization - Visit Number 1    PT Start Time 1614    PT Stop Time 1705    PT Time Calculation (min) 51 min    Activity Tolerance Patient tolerated treatment well    Behavior During Therapy WFL for tasks assessed/performed          Past Medical History:  Diagnosis Date   Anxiety    Anxiety disorder of adolescence 06/09/2015   Arm fracture    Asthma    Constipation    alternates with diarrhea   Depression    Diarrhea    alternates with constipation   Ehlers-Danlos syndrome    per patient   GERD (gastroesophageal reflux disease)    Inflammatory bowel disease    Insomnia 06/09/2015   Past Surgical History:  Procedure Laterality Date   FOOT ARTHRODESIS Left 07/11/2023   Procedure: FUSION, JOINT, FOOT;  Surgeon: Silva Juliene SAUNDERS, DPM;  Location: [facility];  Service: Podiatry;  Laterality: Left;  MID FOOT FUSION   HALLUX VALGUS AKIN Left 07/11/2023   Procedure: CORRECTION, HALLUX VALGUS, WITH AKIN OSTEOTOMY;  Surgeon: Silva Juliene SAUNDERS, DPM;  Location: [facility];  Service: Podiatry;  Laterality: Left;  GENERAL WITH BLOCK   HALLUX VALGUS LAPIDUS Left 07/11/2023   Procedure: ROMAYNE LOOK;  Surgeon: Silva Juliene SAUNDERS, DPM;  Location: [facility];  Service: Podiatry;  Laterality: Left;   NO PAST SURGERIES     Patient Active Problem List   Diagnosis Date Noted   Metatarsus primus varus of left foot 07/13/2023   Acquired hallux valgus of left foot 07/13/2023   Metatarsus adductus of left foot 07/13/2023   Major osseous defect 07/13/2023   Otalgia of both ears 03/06/2023   Body aches 03/06/2023   Hypermobility syndrome  02/18/2023   Suicidal ideation 12/19/2015   MDD (major depressive disorder), recurrent episode, severe (HCC) 12/18/2015   Insomnia 06/09/2015   Anxiety disorder of adolescence 06/09/2015   Severe episode of recurrent major depressive disorder, without psychotic features (HCC) 06/02/2015   Alternating constipation and diarrhea    Nausea & vomiting 09/29/2011   IBS (irritable bowel syndrome) 06/28/2011   GERD (gastroesophageal reflux disease) 06/28/2011   Back pain 08/01/2010   DYSURIA 08/17/2009   ACUTE PHARYNGITIS 04/06/2009   ACUTE BRONCHITIS 04/06/2009   Acute gastritis 04/27/2007   Anxiety state 02/11/2007   Allergic rhinitis 02/11/2007   GERD 12/25/2006   LOOSE STOOLS 12/25/2006   RUQ PAIN 12/25/2006    PCP: Antonio Cyndee Jamee SAUNDERS, DO   REFERRING PROVIDER: Silva Juliene SAUNDERS, DPM   REFERRING DIAG:  M20.12,M21.612 (ICD-10-CM) - Hallux valgus with bunions, left  Q66.229 (ICD-10-CM) - Metatarsus adductus, congenital   Postop bunionectomy 3 months ago difficulty ambulating and swelling in ankle, work on joint manipulation range of motion strengthening and gait training. Evaluate and treat modalities as needed at your discretion twice weekly for 6 to 8 weeks.   THERAPY DIAG:  Pain in left ankle and joints of left foot  Muscle weakness (generalized)  Other abnormalities of gait and mobility  Localized edema  RATIONALE FOR EVALUATION AND TREATMENT: Rehabilitation  ONSET DATE: 07/11/2023 Surgery/procedure:  Renay Bunionectomy Correction metatarsus adductus with 2nd and 3rd TMT fusion with osteotomy Akin phalangeal osteotomy  NEXT MD VISIT: 11/20/23   SUBJECTIVE:                                                                                                                                                                                                         SUBJECTIVE STATEMENT: Pt reports she is still having a lot of pain and feels like she cannot walk right since  surgery on 07/11/23. Pain in L ankle and calf most of the time with pain also in L foot when when weight bearing or she has been sitting to long.  L foot ankle remains somewhat swollen all the time, worse with prolonged sitting or walking.  MD gave her a compression sleeve but it does not seem to help.  MD wants to do surgery on her R foot in September however patient does not want to proceed with surgery until she feels like she is moving normally on her L.  PAIN: Are you having pain? Yes: NPRS scale: 2/10 currently, up to 8/10  Pain location: L ankle, but sometimes also in calf and foot  Pain description: sharp, burning  Aggravating factors: prolonged sitting or standing, walking more than a couple blocks  Relieving factors: elevation, rest, ice   PERTINENT HISTORY:  anxiety, depression, asthma, Ehlers-Danlos/hypermobility syndrome, IBS  PRECAUTIONS: None - No restrictions on activity. Regular shoe gear as tolerated.   RED FLAGS: None  WEIGHT BEARING RESTRICTIONS: No  FALLS:  Has patient fallen in last 6 months? Yes. 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   OBJECTIVE: (objective measures completed at initial evaluation unless otherwise dated)  DIAGNOSTIC FINDINGS:  Not available  PATIENT SURVEYS:  LEFS: 32 / 80 = 40.0 %, moderate functional limitation  COGNITION: Overall cognitive status: Within functional limits for tasks assessed    SENSATION: Continued numbness and tingling in L foot, ankle and calf since surgery  (slightly better in calf)  EDEMA:  Figure 8: R = 48.0 cm, L = 50.0 cm  POSTURE:  B pes planus in standing  PALPATION: Decreased incisional scar mobility, most notable on medial incision Decreased mid and hindfoot mobility Increased muscle tension/tightness in L gastroc/soleus, fibularis muscles, and anterior tibialis  MUSCLE LENGTH:  Heelcord: Mild tight L>R  LOWER EXTREMITY ROM:  Active ROM Right eval Left eval  Ankle dorsiflexion 12 1  Ankle plantarflexion 71 49  Ankle inversion 40 27  Ankle eversion 25 12   Passive ROM Left eval  Ankle dorsiflexion 6  Ankle plantarflexion 50  Ankle inversion 34  Ankle eversion 14  (Blank rows = not tested)  LOWER EXTREMITY MMT:  MMT Right eval Left eval  Hip flexion 5 4-  Hip extension 4+ 3+  Hip abduction 4+ 4-  Hip adduction 4+ 4+  Hip internal rotation 4+ 4-  Hip external rotation 4- 3+  Knee flexion 5 5  Knee extension 5 4+  Ankle dorsiflexion 5 3+  Ankle plantarflexion 4- (8 SLS HR) 2  Ankle inversion 5 3  Ankle eversion 5 3-   (Blank rows = not tested)  FUNCTIONAL TESTS:  5 times sit to stand: 20.25 sec  SLS: R = 37.66 sec, L = 10.78 sec  GAIT: Distance walked: Clinic distances Assistive device utilized: None Level of assistance: Complete Independence Gait pattern: decreased step length- Right, decreased stance time- Left, decreased ankle dorsiflexion- Left, antalgic, and lateral lean- Right   TODAY'S TREATMENT:   10/27/2023  SELF CARE:  Reviewed eval findings and role of PT in addressing identified deficits as well as instruction in initial HEP (see below).    PATIENT EDUCATION:  Education details: PT eval findings, anticipated POC, and initial HEP  Person educated: Patient Education method: Explanation, Demonstration, Verbal cues, Handouts, and MedBridgeGO app access provided Education comprehension: verbalized understanding, returned demonstration, verbal cues required, and needs further  education  HOME EXERCISE PROGRAM: Access Code: [email or web address] URL: [email or web address] Date: 10/27/2023 Prepared by: Elijah Hidden  Exercises - Gastroc Stretch on Wall (Mirrored)  - 2-3 x daily - 7 x weekly - 3 reps - 30 sec hold - Standing Soleus Stretch  - 2-3 x daily - 7 x weekly - 3 reps - 30 sec hold - Ankle Circles in Elevation  - 2-3 x daily - 7 x weekly - 2 sets - 10 reps - Seated Heel Toe Raises  - 2-3 x daily - 7 x weekly - 2 sets - 10 reps - 3 sec hold   ASSESSMENT:  CLINICAL IMPRESSION: Cynitha L Gilden is a 28 y.o. female who was referred to physical therapy for evaluation and treatment s/p correction hallux valgus left foot with metatarsus adductus correction and Akin osteotomy on 07/11/23.  Patient has been released from any postop restrictions and cleared to resume normal activity including running as tolerated.   Patient reports L foot ankle and calf pain persist since surgery.  Pain is worse with prolonged sitting or standing, or walking long distances.  Patient has deficits in L foot/ankle ROM, distal L LE flexibility, L>R LE strength, abnormal posture, and TTP with abnormal muscle tension which are interfering with ADLs and are impacting quality of life.  On LEFS patient scored 32/80 demonstrating moderate functional limitation.  Jazmaine will benefit from skilled PT to address above deficits to improve mobility and activity tolerance with decreased pain interference prior to proceeding with surgery for R foot.  OBJECTIVE IMPAIRMENTS: Abnormal gait, decreased activity tolerance, decreased balance, decreased endurance, decreased knowledge of condition, decreased mobility, difficulty walking, decreased ROM, decreased strength, hypomobility,  increased edema, increased fascial restrictions, impaired perceived functional ability, increased muscle spasms, impaired flexibility, impaired sensation, improper body mechanics, postural dysfunction, pain, and hypermobility.    ACTIVITY LIMITATIONS: carrying, lifting, bending, sitting, standing, squatting, sleeping, stairs, transfers, bathing, locomotion level, and caring for others  PARTICIPATION LIMITATIONS: meal prep, cleaning, laundry, shopping, community activity, occupation, and yard work  PERSONAL FACTORS: Past/current experiences, Profession, Time since onset of injury/illness/exacerbation, and 3+ comorbidities: anxiety, depression, asthma, Ehlers-Danlos/hypermobility syndrome, IBS are also affecting patient's functional outcome.   REHAB POTENTIAL: Good  CLINICAL DECISION MAKING: Evolving/moderate complexity  EVALUATION COMPLEXITY: Moderate   GOALS: Goals reviewed with patient? Yes  SHORT TERM GOALS: Target date: 12/01/2023  Patient will be independent with initial HEP. Baseline: Initial HEP provided on eval Goal status: INITIAL  2.  Patient will report at least 25% improvement in L foot/ankle/calf pain to improve QOL. Baseline: 2/10 on eval, up to 8/10 Goal status: INITIAL  3.  Patient will improve 5x STS time to </= 15 seconds to demonstrate improved functional strength and transfer efficiency.  Baseline: 20.25 sec Goal status: INITIAL   LONG TERM GOALS: Target date: 01/05/2024  Patient will be independent with advanced/ongoing HEP to improve outcomes and carryover.  Baseline:  Goal status: INITIAL  2.  Patient will report at least 50-75% improvement in L foot/ankle/calf pain to improve QOL. Baseline: 2/10 on eval, up to 8/10 Goal status: INITIAL  3.  Patient will demonstrate improved L ankle AROM to [facility] to allow for normal gait and stair mechanics. Baseline: Refer to above LE ROM table Goal status: INITIAL  4.  Patient will demonstrate improved B LE strength to >/= 4+/5 with exception of L PF to >/= 3+/5 for improved stability and ease of mobility. Baseline: Refer to above LE MMT table Goal status: INITIAL  5.  Patient will be able to ambulate ~1 mile with normal gait pattern  without increased foot/ankle pain to access community.  Baseline: Antalgic gait pattern with gait distance limited to only a few blocks Goal status: INITIAL  6. Patient will be able to ascend/descend stairs with 1 HR and reciprocal step pattern safely to access home and community.  Baseline:  Goal status: INITIAL  7.  Patient will report >/= 45/80 on LEFS to demonstrate improved functional ability. Baseline: 32 / 80 = 40.0 % Goal status: INITIAL  8.  Patient will improve L SLS to >/= 20 sec to demonstrate improved L ankle stability. Baseline: 10.78 sec Goal status: INITIAL    PLAN:  PT FREQUENCY: 2x/week  PT DURATION: 8-10 weeks - patient's training schedule for her new job will not allow her to initiate therapy treatment visits until the week of 11/10/2023  PLANNED INTERVENTIONS: 97164- PT Re-evaluation, 97750- Physical Performance Testing, 97110-Therapeutic exercises, 97530- Therapeutic activity, 97112- Neuromuscular re-education, 97535- Self Care, [id number]- Manual therapy, [phone]- Gait training, [phone]- Aquatic Therapy, [phone]- Electrical stimulation (unattended), 97016- Vasopneumatic device, [id number]- Ultrasound, [id number]- Ionotophoresis 4mg /ml Dexamethasone , 79439 (1-2 muscles), 20561 (3+ muscles)- Dry Needling, Patient/Family education, Balance training, Stair training, Taping, Joint mobilization, Scar mobilization, DME instructions, Cryotherapy, and Moist heat  PLAN FOR NEXT SESSION: Review initial HEP; MT including STM/joint mobilizations to reduce edema and improve L ankle ROM; L ankle P/AROM; gently progress L ankle and proximal LE strengthening - update HEP accordingly; initiate proprioceptive and balance training   Elijah CHRISTELLA Hidden, PT 10/27/2023, 6:53 PM

## 2023-10-27 ENCOUNTER — Encounter: Payer: Self-pay | Admitting: Physical Therapy

## 2023-10-27 ENCOUNTER — Ambulatory Visit: Attending: Podiatry | Admitting: Physical Therapy

## 2023-10-27 ENCOUNTER — Other Ambulatory Visit: Payer: Self-pay

## 2023-10-27 DIAGNOSIS — Q66229 Congenital metatarsus adductus, unspecified foot: Secondary | ICD-10-CM | POA: Diagnosis not present

## 2023-10-27 DIAGNOSIS — M25572 Pain in left ankle and joints of left foot: Secondary | ICD-10-CM | POA: Insufficient documentation

## 2023-10-27 DIAGNOSIS — R600 Localized edema: Secondary | ICD-10-CM | POA: Insufficient documentation

## 2023-10-27 DIAGNOSIS — Z713 Dietary counseling and surveillance: Secondary | ICD-10-CM | POA: Diagnosis not present

## 2023-10-27 DIAGNOSIS — M6281 Muscle weakness (generalized): Secondary | ICD-10-CM | POA: Diagnosis not present

## 2023-10-27 DIAGNOSIS — M21612 Bunion of left foot: Secondary | ICD-10-CM | POA: Insufficient documentation

## 2023-10-27 DIAGNOSIS — R2689 Other abnormalities of gait and mobility: Secondary | ICD-10-CM | POA: Insufficient documentation

## 2023-10-27 DIAGNOSIS — M2012 Hallux valgus (acquired), left foot: Secondary | ICD-10-CM | POA: Insufficient documentation

## 2023-10-29 ENCOUNTER — Telehealth: Payer: Self-pay | Admitting: Family Medicine

## 2023-10-29 NOTE — Telephone Encounter (Signed)
 Copied from CRM #[id number]. Topic: General - Other >> Oct 29, 2023  9:07 AM Aleatha BROCKS wrote: Reason for CRM: Iris Nurse Case manager  from [place] called for a outreach call to inform Dr Antonio Meth that patient is in a outreach program and if there any areas  concerns you call them [phone] ext [phone]

## 2023-10-29 NOTE — Telephone Encounter (Signed)
 FYI

## 2023-10-30 DIAGNOSIS — F411 Generalized anxiety disorder: Secondary | ICD-10-CM | POA: Diagnosis not present

## 2023-11-03 ENCOUNTER — Ambulatory Visit: Admitting: Physical Therapy

## 2023-11-03 DIAGNOSIS — F411 Generalized anxiety disorder: Secondary | ICD-10-CM | POA: Diagnosis not present

## 2023-11-04 DIAGNOSIS — F3181 Bipolar II disorder: Secondary | ICD-10-CM | POA: Diagnosis not present

## 2023-11-04 DIAGNOSIS — F411 Generalized anxiety disorder: Secondary | ICD-10-CM | POA: Diagnosis not present

## 2023-11-04 DIAGNOSIS — Z419 Encounter for procedure for purposes other than remedying health state, unspecified: Secondary | ICD-10-CM | POA: Diagnosis not present

## 2023-11-04 DIAGNOSIS — F4312 Post-traumatic stress disorder, chronic: Secondary | ICD-10-CM | POA: Diagnosis not present

## 2023-11-10 ENCOUNTER — Encounter

## 2023-11-10 DIAGNOSIS — Z713 Dietary counseling and surveillance: Secondary | ICD-10-CM | POA: Diagnosis not present

## 2023-11-11 ENCOUNTER — Other Ambulatory Visit (HOSPITAL_COMMUNITY)
Admission: RE | Admit: 2023-11-11 | Discharge: 2023-11-11 | Disposition: A | Discharge status: Home / Self Care | Source: Ambulatory Visit | Attending: Family Medicine | Admitting: Family Medicine

## 2023-11-11 ENCOUNTER — Encounter: Payer: Self-pay | Admitting: Family Medicine

## 2023-11-11 ENCOUNTER — Ambulatory Visit: Admitting: Physical Therapy

## 2023-11-11 ENCOUNTER — Ambulatory Visit (INDEPENDENT_AMBULATORY_CARE_PROVIDER_SITE_OTHER): Admitting: Family Medicine

## 2023-11-11 VITALS — BP 118/66 | HR 74 | Temp 98.4°F | Resp 18 | Ht 63.0 in | Wt 153.0 lb

## 2023-11-11 DIAGNOSIS — R600 Localized edema: Secondary | ICD-10-CM | POA: Diagnosis not present

## 2023-11-11 DIAGNOSIS — Z113 Encounter for screening for infections with a predominantly sexual mode of transmission: Secondary | ICD-10-CM | POA: Diagnosis not present

## 2023-11-11 DIAGNOSIS — M25572 Pain in left ankle and joints of left foot: Secondary | ICD-10-CM | POA: Diagnosis not present

## 2023-11-11 DIAGNOSIS — R300 Dysuria: Secondary | ICD-10-CM

## 2023-11-11 DIAGNOSIS — Z0000 Encounter for general adult medical examination without abnormal findings: Secondary | ICD-10-CM | POA: Diagnosis not present

## 2023-11-11 DIAGNOSIS — Q66229 Congenital metatarsus adductus, unspecified foot: Secondary | ICD-10-CM | POA: Diagnosis not present

## 2023-11-11 DIAGNOSIS — M21612 Bunion of left foot: Secondary | ICD-10-CM | POA: Diagnosis not present

## 2023-11-11 DIAGNOSIS — R2689 Other abnormalities of gait and mobility: Secondary | ICD-10-CM | POA: Diagnosis not present

## 2023-11-11 DIAGNOSIS — M2012 Hallux valgus (acquired), left foot: Secondary | ICD-10-CM | POA: Diagnosis not present

## 2023-11-11 DIAGNOSIS — M6281 Muscle weakness (generalized): Secondary | ICD-10-CM | POA: Diagnosis not present

## 2023-11-11 DIAGNOSIS — L749 Eccrine sweat disorder, unspecified: Secondary | ICD-10-CM

## 2023-11-11 LAB — POCT URINALYSIS DIPSTICK
Bilirubin, UA: NEGATIVE
Blood, UA: NEGATIVE
Glucose, UA: NEGATIVE
Ketones, UA: NEGATIVE
Leukocytes, UA: NEGATIVE
Nitrite, UA: NEGATIVE
Protein, UA: NEGATIVE
Spec Grav, UA: <=1.005 — AB (ref 1.010–1.025)
Urobilinogen, UA: 0.2 U/dL
pH, UA: 7.5 (ref 5.0–8.0)

## 2023-11-11 NOTE — Therapy (Signed)
 OUTPATIENT PHYSICAL THERAPY LOWER EXTREMITY TREATMENT   Patient Name: SEMA STANGLER MRN: [id number] DOB:Apr 30, 1995, 28 y.o., female Today's Date: 11/11/2023   END OF SESSION:  PT End of Session - 11/11/23 0801     Visit Number 2    Date for PT Re-Evaluation 01/05/24    Authorization Type [employer] & [facility] Medicaid    Authorization - Visit Number --    PT Start Time 0801    PT Stop Time 0843    PT Time Calculation (min) 42 min    Activity Tolerance Patient tolerated treatment well    Behavior During Therapy [facility] for tasks assessed/performed          Past Medical History:  Diagnosis Date   Anxiety    Anxiety disorder of adolescence 06/09/2015   Arm fracture    Asthma    Constipation    alternates with diarrhea   Depression    Diarrhea    alternates with constipation   Ehlers-Danlos syndrome    per patient   GERD (gastroesophageal reflux disease)    Inflammatory bowel disease    Insomnia 06/09/2015   Past Surgical History:  Procedure Laterality Date   FOOT ARTHRODESIS Left 07/11/2023   Procedure: FUSION, JOINT, FOOT;  Surgeon: Silva Juliene SAUNDERS, DPM;  Location: [facility] OR[facility];  Service: Podiatry;  Laterality: Left;  MID FOOT FUSION   HALLUX VALGUS AKIN Left 07/11/2023   Procedure: CORRECTION, HALLUX VALGUS, WITH AKIN OSTEOTOMY;  Surgeon: Silva Juliene SAUNDERS, DPM;  Location: [facility];  Service: Podiatry;  Laterality: Left;  GENERAL WITH BLOCK   HALLUX VALGUS LAPIDUS Left 07/11/2023   Procedure: ROMAYNE LOOK;  Surgeon: Silva Juliene SAUNDERS, DPM;  Location: [facility];  Service: Podiatry;  Laterality: Left;   NO PAST SURGERIES     Patient Active Problem List   Diagnosis Date Noted   Metatarsus primus varus of left foot 07/13/2023   Acquired hallux valgus of left foot 07/13/2023   Metatarsus adductus of left foot 07/13/2023   Major osseous defect 07/13/2023   Otalgia of both ears 03/06/2023   Body aches 03/06/2023   Hypermobility syndrome 02/18/2023   Suicidal ideation  12/19/2015   MDD (major depressive disorder), recurrent episode, severe (HCC) 12/18/2015   Insomnia 06/09/2015   Anxiety disorder of adolescence 06/09/2015   Severe episode of recurrent major depressive disorder, without psychotic features (HCC) 06/02/2015   Alternating constipation and diarrhea    Nausea & vomiting 09/29/2011   IBS (irritable bowel syndrome) 06/28/2011   GERD (gastroesophageal reflux disease) 06/28/2011   Back pain 08/01/2010   DYSURIA 08/17/2009   ACUTE PHARYNGITIS 04/06/2009   ACUTE BRONCHITIS 04/06/2009   Acute gastritis 04/27/2007   Anxiety state 02/11/2007   Allergic rhinitis 02/11/2007   GERD 12/25/2006   LOOSE STOOLS 12/25/2006   RUQ PAIN 12/25/2006    PCP: Antonio Cyndee Jamee SAUNDERS, DO   REFERRING PROVIDER: Silva Juliene SAUNDERS, DPM   REFERRING DIAG:  M20.12,M21.612 (ICD-10-CM) - Hallux valgus with bunions, left  Q66.229 (ICD-10-CM) - Metatarsus adductus, congenital   Postop bunionectomy 3 months ago difficulty ambulating and swelling in ankle, work on joint manipulation range of motion strengthening and gait training. Evaluate and treat modalities as needed at your discretion twice weekly for 6 to 8 weeks.   THERAPY DIAG:  Pain in left ankle and joints of left foot  Muscle weakness (generalized)  Other abnormalities of gait and mobility  Localized edema  RATIONALE FOR EVALUATION AND TREATMENT: Rehabilitation  ONSET DATE: 07/11/2023 Surgery/procedure:  LOOK  Bunionectomy Correction metatarsus adductus with 2nd and 3rd TMT fusion with osteotomy Akin phalangeal osteotomy  NEXT MD VISIT: 11/20/23   SUBJECTIVE:                                                                                                                                                                                                         SUBJECTIVE STATEMENT: Pt reports she is doing good. She states she feel like she was able to get the scar tissue down. She feels like she sees a  difference already with exercises   PAIN: Are you having pain? Yes: NPRS scale: 3/10 currently  Pain location: L ankle, but sometimes also in calf and foot  Pain description: sharp, burning  Aggravating factors: prolonged sitting or standing, walking more than a couple blocks  Relieving factors: elevation, rest, ice   PERTINENT HISTORY:  anxiety, depression, asthma, Ehlers-Danlos/hypermobility syndrome, IBS  PRECAUTIONS: None - No restrictions on activity. Regular shoe gear as tolerated.   RED FLAGS: None  WEIGHT BEARING RESTRICTIONS: No  FALLS:  Has patient fallen in last 6 months? Yes. 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   OBJECTIVE: (objective measures completed at initial evaluation unless otherwise dated)  DIAGNOSTIC FINDINGS:  Not available  PATIENT SURVEYS:  LEFS: 32 / 80 = 40.0 %, moderate functional limitation  COGNITION: Overall cognitive status: Within functional limits for tasks assessed    SENSATION: Continued numbness and tingling in L foot, ankle and calf since surgery (slightly better in calf)  EDEMA:  Figure 8: R = 48.0 cm, L = 50.0 cm    POSTURE:  B pes planus in standing  PALPATION: Decreased incisional scar mobility, most notable on medial incision Decreased mid and hindfoot mobility Increased muscle tension/tightness in L gastroc/soleus, fibularis muscles, and anterior tibialis  MUSCLE LENGTH:  Heelcord: Mild tight L>R  LOWER EXTREMITY ROM:  Active ROM Right eval Left eval   Ankle dorsiflexion 12 1  Ankle plantarflexion 71 49  Ankle inversion 40 27  Ankle eversion 25 12   Passive ROM Left eval  Ankle dorsiflexion 6  Ankle plantarflexion 50  Ankle inversion 34  Ankle eversion 14  (Blank rows = not tested)  LOWER EXTREMITY MMT:  MMT Right eval Left eval  Hip flexion 5 4-  Hip extension 4+ 3+  Hip abduction 4+ 4-  Hip adduction 4+ 4+  Hip internal rotation 4+ 4-  Hip external rotation 4- 3+  Knee flexion 5 5  Knee extension 5 4+  Ankle dorsiflexion 5 3+  Ankle plantarflexion 4- (8 SLS HR) 2  Ankle inversion 5 3  Ankle eversion 5 3-   (Blank rows = not tested)  FUNCTIONAL TESTS:  5 times sit to stand: 20.25 sec  SLS: R = 37.66 sec, L = 10.78 sec  GAIT: Distance walked: Clinic distances Assistive device utilized: None Level of assistance: Complete Independence Gait pattern: decreased step length- Right, decreased stance time- Left, decreased ankle dorsiflexion- Left, antalgic, and lateral lean- Right   TODAY'S TREATMENT:  11/11/23  THERAPEUTIC EXERCISE: To improve strength, endurance, ROM, and flexibility.  Demonstration, verbal and tactile cues throughout for technique.  S/L clams w/ YTB at distal thighs 2x10 B  Seated Resisted DF AROM w/ YTB 2x10 B  Seated Resisted PF AROM w/ YTB 2x10 B  Seated Resisted ankle eversion w/ YTB 2x10 B  NEURO RE-ED (corner balance) Static Stance x30  Static Stance with eyes closed x30- L ankle feels less steady  Staggered stance x30 B- more difficult w/LLE back  Tandem Stance x30 B- harder with LLE back  SLS x30 B- much more difficult on LLE, pain in front and lateral ankle  SELF CARE: Provided education for HEP review including recommended frequency for home performance.  Reviewed HEP w/ patient  Gastroc Stretch 2x30  Soleus Stretch 2x30  Ankle circles x10 ea direction Heel/Toe Raises x20   10/27/2023  SELF CARE:  Reviewed eval findings and role of PT in addressing identified deficits as well  as instruction in initial HEP (see below).    PATIENT EDUCATION:  Education details: anticipated POC, initial HEP, and HEP update  Person educated: Patient Education method: Explanation, Demonstration, Verbal cues, Handouts, and MedBridgeGO app access provided Education comprehension: verbalized understanding, returned demonstration, verbal cues required, and needs further education  HOME EXERCISE PROGRAM: Access Code: [email or web address] URL: [email or web address] Date: 11/11/2023 Prepared by: Eusebio Saba  Exercises - Gastroc Stretch on Wall (Mirrored)  - 2-3 x daily - 7 x weekly - 3 reps - 30 sec hold - Standing Soleus Stretch  - 2-3 x daily - 7 x weekly - 3 reps - 30 sec hold - Ankle Circles in Elevation  - 2-3 x daily - 7 x weekly - 2 sets - 10 reps - Seated Heel Toe Raises  - 2-3 x daily - 7 x weekly - 2 sets - 10 reps - 3 sec hold - Clamshell with Resistance  - 1 x daily - 7 x weekly - 2 sets - 10 reps - Long Sitting Ankle Dorsiflexion with Anchored Resistance  - 1 x daily - 7 x weekly - 2 sets - 10 reps - Long Sitting Ankle Plantar Flexion with Resistance  - 1 x daily - 7 x weekly - 2 sets - 10 reps - Long Sitting Ankle Eversion with Resistance  - 1 x daily - 7 x weekly - 2 sets - 10 reps   ASSESSMENT:  CLINICAL IMPRESSION: Silvana reports that she has noticed improvement in scar tissue and overall ankle mobility with walking. Her HEP is going well and she has been consistent even when her ankle stiffens  up at times still. During today's session, pt was able to move the left ankle better during all activities reporting that she has noticed improved ROM already with her current HEP. Began gentle LE strengthening exercises with focus on ankle strengthening. Pt was introduced to light resistance and stated that with eversion she feels it is more challenging to move her ankle as she needed to use more knee and hip to complete the motion. Pt's incisions are healing well and  swelling has decreased. Pt was also introduced to corner balance exercises and reported difficulty with L SLS, staggered stance, and tandem stance when the LLE needed to hold and maintain more of her weight. She reported some pain/discomfort with L SLS in particular. Pt is aware of new additions to HEP and expressed she is looking forward to therapy as she already sees improvement. Quinley will continue to benefit from skilled PT interventions addressing deficits to improve mobility and activity tolerance w/ dec'd pain interference.     EVAL: Makensie L Colegrove is a 28 y.o. female who was referred to physical therapy for evaluation and treatment s/p correction hallux valgus left foot with metatarsus adductus correction and Akin osteotomy on 07/11/23.  Patient has been released from any postop restrictions and cleared to resume normal activity including running as tolerated.   Patient reports L foot ankle and calf pain persist since surgery.  Pain is worse with prolonged sitting or standing, or walking long distances.  Patient has deficits in L foot/ankle ROM, distal L LE flexibility, L>R LE strength, abnormal posture, and TTP with abnormal muscle tension which are interfering with ADLs and are impacting quality of life.  On LEFS patient scored 32/80 demonstrating moderate functional limitation.  Shila will benefit from skilled PT to address above deficits to improve mobility and activity tolerance with decreased pain interference prior to proceeding with surgery for R foot.  OBJECTIVE IMPAIRMENTS: Abnormal gait, decreased activity tolerance, decreased balance, decreased endurance, decreased knowledge of condition, decreased mobility, difficulty walking, decreased ROM, decreased strength, hypomobility, increased edema, increased fascial restrictions, impaired perceived functional ability, increased muscle spasms, impaired flexibility, impaired sensation, improper body mechanics, postural dysfunction, pain, and  hypermobility.   ACTIVITY LIMITATIONS: carrying, lifting, bending, sitting, standing, squatting, sleeping, stairs, transfers, bathing, locomotion level, and caring for others  PARTICIPATION LIMITATIONS: meal prep, cleaning, laundry, shopping, community activity, occupation, and yard work  PERSONAL FACTORS: Past/current experiences, Profession, Time since onset of injury/illness/exacerbation, and 3+ comorbidities: anxiety, depression, asthma, Ehlers-Danlos/hypermobility syndrome, IBS are also affecting patient's functional outcome.   REHAB POTENTIAL: Good  CLINICAL DECISION MAKING: Evolving/moderate complexity  EVALUATION COMPLEXITY: Moderate   GOALS: Goals reviewed with patient? Yes  SHORT TERM GOALS: Target date: 12/01/2023  Patient will be independent with initial HEP. Baseline: Initial HEP provided on eval Goal status: MET-11/11/23: pt is doing HEP consistently   2.  Patient will report at least 25% improvement in L foot/ankle/calf pain to improve QOL. Baseline: 2/10 on eval, up to 8/10 Goal status: INITIAL  3.  Patient will improve 5x STS time to </= 15 seconds to demonstrate improved functional strength and transfer efficiency.  Baseline: 20.25 sec Goal status: INITIAL   LONG TERM GOALS: Target date: 01/05/2024  Patient will be independent with advanced/ongoing HEP to improve outcomes and carryover.  Baseline:  Goal status: INITIAL  2.  Patient will report at least 50-75% improvement in L foot/ankle/calf pain to improve QOL. Baseline: 2/10 on eval, up to 8/10 Goal status: INITIAL  3.  Patient will  demonstrate improved L ankle AROM to [facility] to allow for normal gait and stair mechanics. Baseline: Refer to above LE ROM table Goal status: INITIAL  4.  Patient will demonstrate improved B LE strength to >/= 4+/5 with exception of L PF to >/= 3+/5 for improved stability and ease of mobility. Baseline: Refer to above LE MMT table Goal status: INITIAL  5.  Patient will be  able to ambulate ~1 mile with normal gait pattern without increased foot/ankle pain to access community.  Baseline: Antalgic gait pattern with gait distance limited to only a few blocks Goal status: INITIAL  6. Patient will be able to ascend/descend stairs with 1 HR and reciprocal step pattern safely to access home and community.  Baseline:  Goal status: INITIAL  7.  Patient will report >/= 45/80 on LEFS to demonstrate improved functional ability. Baseline: 32 / 80 = 40.0 % Goal status: INITIAL  8.  Patient will improve L SLS to >/= 20 sec to demonstrate improved L ankle stability. Baseline: 10.78 sec Goal status: INITIAL    PLAN:  PT FREQUENCY: 2x/week  PT DURATION: 8-10 weeks - patient's training schedule for her new job will not allow her to initiate therapy treatment visits until the week of 11/10/2023  PLANNED INTERVENTIONS: 97164- PT Re-evaluation, 97750- Physical Performance Testing, 97110-Therapeutic exercises, 97530- Therapeutic activity, 97112- Neuromuscular re-education, 97535- Self Care, [id number]- Manual therapy, [phone]- Gait training, [phone]- Aquatic Therapy, [phone]- Electrical stimulation (unattended), 97016- Vasopneumatic device, [id number]- Ultrasound, [id number]- Ionotophoresis 4mg /ml Dexamethasone , 79439 (1-2 muscles), [id number] (3+ muscles)- Dry Needling, Patient/Family education, Balance training, Stair training, Taping, Joint mobilization, Scar mobilization, DME instructions, Cryotherapy, and Moist heat  PLAN FOR NEXT SESSION: Review updated HEP; MT including STM/joint mobilizations to reduce edema and improve L ankle ROM; L ankle P/AROM; gently progress L ankle and proximal LE strengthening - update HEP accordingly; continue proprioceptive and balance training   Taela Charbonneau, Student-PT 11/11/2023, 9:04 AM

## 2023-11-11 NOTE — Progress Notes (Signed)
 +  +  Subjective:    Patient ID: Dana [name], female    DOB: [birth year], 28 y.o.   MRN: [id number]  No chief complaint on file.   HPI Patient is in today for persistant uti.  Discussed the use of AI scribe software for clinical note transcription with the patient, who gave verbal consent to proceed.  History of Present Illness Dana [name] is a 28 year old female who presents with persistent urinary tract infection symptoms and excessive sweating.  She has been experiencing symptoms of a urinary tract infection for the past month, including frequent urination, dysuria, increased thirst, and abdominal and lower back cramps. She is unsure about the presence of hematuria due to irregular menstrual cycles, which sometimes include brown or red discharge. Despite treatment with sulfamethoxazole/trimethoprim and nitrofurantoin, her symptoms persist.  In addition to the urinary symptoms, she reports excessive sweating that is not related to external temperature, as she stays indoors. She experiences night sweats and sweats through her clothes multiple times a day, requiring frequent changes of clothing and multiple showers daily. She has tried various deodorants and cornstarch without relief.  She ran out of hydroxyzine , which she uses for pruritus, leading to scratching and bruising on her skin.  She is unsure about the presence of discharge, but she does mention an odor. She occasionally experiences night sweats and has a history of irregular menstrual cycles.   Past Medical History:  Diagnosis Date   Anxiety    Anxiety disorder of adolescence 06/09/2015   Arm fracture    Asthma    Constipation    alternates with diarrhea   Depression    Diarrhea    alternates with constipation   Ehlers-Danlos syndrome    per patient   GERD (gastroesophageal reflux disease)    Inflammatory bowel disease    Insomnia 06/09/2015    Past Surgical History:  Procedure Laterality Date    FOOT ARTHRODESIS Left 07/11/2023   Procedure: FUSION, JOINT, FOOT;  Surgeon: Silva Juliene SAUNDERS, DPM;  Location: [facility];  Service: Podiatry;  Laterality: Left;  MID FOOT FUSION   HALLUX VALGUS AKIN Left 07/11/2023   Procedure: CORRECTION, HALLUX VALGUS, WITH AKIN OSTEOTOMY;  Surgeon: Silva Juliene SAUNDERS, DPM;  Location: [facility];  Service: Podiatry;  Laterality: Left;  GENERAL WITH BLOCK   HALLUX VALGUS LAPIDUS Left 07/11/2023   Procedure: ROMAYNE LOOK;  Surgeon: Silva Juliene SAUNDERS, DPM;  Location: [facility];  Service: Podiatry;  Laterality: Left;   NO PAST SURGERIES      Family History  Problem Relation Age of Onset   Drug abuse Mother    Depression Mother    Drug abuse Father    Diabetes Maternal Grandmother    Parkinson's disease Maternal Grandmother    Diabetes Maternal Grandfather    Cancer Maternal Grandfather        leukemia   Diabetes Maternal Aunt    Cancer Maternal Aunt        renal   Depression Maternal Aunt    Alcohol abuse Maternal Aunt    Depression Maternal Aunt    Alcohol abuse Maternal Uncle    Depression Maternal Uncle    Coronary artery disease Other    Diabetes Other        paternal side  + hx diabetes   Other Other        Brain Tumor   Stomach cancer Neg Hx    Esophageal cancer Neg Hx  Social History   Socioeconomic History   Marital status: Married    Spouse name: Not on file   Number of children: 0   Years of education: 12   Highest education level: Not on file  Occupational History   Occupation: [employer]  Tobacco Use   Smoking status: Former    Current packs/day: 0.25    Average packs/day: 0.3 packs/day for 1 year (0.3 ttl pk-yrs)    Types: Cigarettes   Smokeless tobacco: Current  Vaping Use   Vaping status: Some Days  Substance and Sexual Activity   Alcohol use: No   Drug use: Yes    Types: Marijuana   Sexual activity: Yes    Partners: Male    Birth control/protection: Implant  Other Topics Concern   Not on file  Social  History Narrative   Lives with mother, [place]   Caffeine use: 1/day - coffee/tea   Social Drivers of Corporate investment banker Strain: Not on file  Food Insecurity: Not on file  Transportation Needs: Not on file  Physical Activity: Not on file  Stress: Not on file  Social Connections: Unknown (08/07/2021)   Received from [facility]   Social Network    Social Network: Not on file  Intimate Partner Violence: Unknown (06/29/2021)   Received from [facility]   HITS    Physically Hurt: Not on file    Insult or Talk Down To: Not on file    Threaten Physical Harm: Not on file    Scream or Curse: Not on file    Outpatient Medications Prior to Visit  Medication Sig Dispense Refill   Cholecalciferol (VITAMIN D -3 PO) Take 2 tablets by mouth daily. Gummy     etonogestrel  (NEXPLANON ) 68 MG IMPL implant 1 each by Subdermal route once.     gabapentin  (NEURONTIN ) 100 MG capsule Take 100 mg by mouth daily as needed (Nerve pain).     gabapentin  (NEURONTIN ) 300 MG capsule TAKE 1 CAPSULE(300 MG) BY MOUTH THREE TIMES DAILY FOR 7 DAYS (Patient not taking: Reported on 10/27/2023) 21 capsule 0   hydrOXYzine  (ATARAX ) 10 MG tablet Take 20 mg by mouth at bedtime as needed for anxiety.     lamoTRIgine (LAMICTAL) 150 MG tablet Take 150 mg by mouth at bedtime.     lamoTRIgine (LAMICTAL) 25 MG tablet Take 50 mg by mouth in the morning.     Multiple Vitamins-Minerals (MULTIVITAMIN WITH MINERALS) tablet Take 1 tablet by mouth daily.     ondansetron  (ZOFRAN ) 4 MG tablet Take 1 tablet (4 mg total) by mouth 2 (two) times daily as needed for nausea or vomiting. 60 tablet 5   PARoxetine (PAXIL) 30 MG tablet Take 30 mg by mouth at bedtime.     prazosin (MINIPRESS) 2 MG capsule Take 2 mg by mouth at bedtime.     valACYclovir  (VALTREX ) 1000 MG tablet Take 1 tablet (1,000 mg total) by mouth 2 (two) times daily. 30 tablet 0   No facility-administered medications prior to visit.    Allergies  Allergen Reactions    Bupropion Hives and Palpitations   Menthol     Burns    Other Itching    MINT Bugs    Review of Systems  Constitutional:  Negative for fever and malaise/fatigue.  HENT:  Negative for congestion.   Eyes:  Negative for blurred vision.  Respiratory:  Negative for cough and shortness of breath.   Cardiovascular:  Negative for chest pain, palpitations and leg swelling.  Gastrointestinal:  Negative for vomiting.  Genitourinary:  Positive for dysuria, flank pain and frequency.  Musculoskeletal:  Negative for back pain.  Skin:  Negative for rash.  Neurological:  Negative for loss of consciousness and headaches.       Objective:    Physical Exam Vitals and nursing note reviewed.  Constitutional:      General: She is not in acute distress.    Appearance: Normal appearance. She is well-developed.  HENT:     Head: Normocephalic and atraumatic.  Eyes:     General: No scleral icterus.       Right eye: No discharge.        Left eye: No discharge.  Cardiovascular:     Rate and Rhythm: Normal rate and regular rhythm.     Heart sounds: No murmur heard. Pulmonary:     Effort: Pulmonary effort is normal. No respiratory distress.     Breath sounds: Normal breath sounds.  Musculoskeletal:        General: Normal range of motion.     Cervical back: Normal range of motion and neck supple.     Right lower leg: No edema.     Left lower leg: No edema.  Skin:    General: Skin is warm and dry.  Neurological:     Mental Status: She is alert and oriented to person, place, and time.  Psychiatric:        Mood and Affect: Mood normal.        Behavior: Behavior normal.        Thought Content: Thought content normal.        Judgment: Judgment normal.     BP 118/66   Pulse 74   Temp 98.4 F (36.9 C)   Resp 18   Ht 5' 3 (1.6 m)   Wt 153 lb (69.4 kg)   SpO2 96%   BMI 27.10 kg/m  Wt Readings from Last 3 Encounters:  11/11/23 153 lb (69.4 kg)  07/17/23 145 lb (65.8 kg)  07/11/23 145 lb  (65.8 kg)    Diabetic Foot Exam - Simple   No data filed    Lab Results  Component Value Date   WBC 8.1 01/16/2023   HGB 13.4 01/16/2023   HCT 41.1 01/16/2023   PLT 348.0 01/16/2023   GLUCOSE 86 01/16/2023   CHOL 210 (H) 01/16/2023   TRIG 153.0 (H) 01/16/2023   HDL 49.60 01/16/2023   LDLCALC 130 (H) 01/16/2023   ALT 10 01/16/2023   AST 16 01/16/2023   NA 139 01/16/2023   K 4.4 01/16/2023   CL 102 01/16/2023   CREATININE 0.63 01/16/2023   BUN 13 01/16/2023   CO2 28 01/16/2023   TSH 0.89 01/16/2023   HGBA1C 5.5 12/19/2015    Lab Results  Component Value Date   TSH 0.89 01/16/2023   Lab Results  Component Value Date   WBC 8.1 01/16/2023   HGB 13.4 01/16/2023   HCT 41.1 01/16/2023   MCV 90.6 01/16/2023   PLT 348.0 01/16/2023   Lab Results  Component Value Date   NA 139 01/16/2023   K 4.4 01/16/2023   CO2 28 01/16/2023   GLUCOSE 86 01/16/2023   BUN 13 01/16/2023   CREATININE 0.63 01/16/2023   BILITOT 0.4 01/16/2023   ALKPHOS 67 01/16/2023   AST 16 01/16/2023   ALT 10 01/16/2023   PROT 7.4 01/16/2023   ALBUMIN 4.9 01/16/2023   CALCIUM 9.7 06/26/2023   ANIONGAP 9 03/11/2019  GFR 123.82 01/16/2023   Lab Results  Component Value Date   CHOL 210 (H) 01/16/2023   Lab Results  Component Value Date   HDL 49.60 01/16/2023   Lab Results  Component Value Date   LDLCALC 130 (H) 01/16/2023   Lab Results  Component Value Date   TRIG 153.0 (H) 01/16/2023   Lab Results  Component Value Date   CHOLHDL 4 01/16/2023   Lab Results  Component Value Date   HGBA1C 5.5 12/19/2015       Assessment & Plan:  Dysuria -     POCT urinalysis dipstick -     Urine Culture  Sweating abnormality -     CBC with Differential/Platelet -     Comprehensive metabolic panel with GFR -     Thyroid  Panel With TSH  Screening for STD (sexually transmitted disease) -     Cervicovaginal ancillary only  Assessment and Plan Assessment & Plan Urinary symptoms (dysuria,  frequency, possible urinary tract infection)   She experiences persistent urinary symptoms, including dysuria, frequency, and increased thirst. Previous treatments with sulfamethoxazole and nitrofurantoin were ineffective. Urinalysis appears negative, but a culture is pending to rule out a UTI. Differential diagnosis includes possible yeast or bacterial infection. Order a urine culture and perform a self-swab to check for yeast or bacterial infection.  Generalized hyperhidrosis (excessive sweating)   She has excessive sweating not related to external temperature, occurring both day and night, causing frequent clothing changes and discomfort. Order blood work for future evaluation.  Pruritus due to hydroxyzine  discontinuation   Pruritus has developed due to running out of hydroxyzine , leading to scratching and resultant bruising and marks.    Inocente Krach R Lowne Chase, DO

## 2023-11-12 LAB — CERVICOVAGINAL ANCILLARY ONLY
Bacterial Vaginitis (gardnerella): NEGATIVE
Candida Glabrata: NEGATIVE
Candida Vaginitis: NEGATIVE
Chlamydia: NEGATIVE
Comment: NEGATIVE
Comment: NEGATIVE
Comment: NEGATIVE
Comment: NEGATIVE
Comment: NEGATIVE
Comment: NORMAL
Neisseria Gonorrhea: NEGATIVE
Trichomonas: NEGATIVE

## 2023-11-12 LAB — URINE CULTURE
MICRO NUMBER:: 16852109
SPECIMEN QUALITY:: ADEQUATE

## 2023-11-12 NOTE — Addendum Note (Signed)
 Addended by: TRUDY CURVIN RAMAN on: 11/12/2023 02:21 PM   Modules accepted: Orders

## 2023-11-13 ENCOUNTER — Other Ambulatory Visit (INDEPENDENT_AMBULATORY_CARE_PROVIDER_SITE_OTHER)

## 2023-11-13 ENCOUNTER — Ambulatory Visit: Admitting: Physical Therapy

## 2023-11-13 ENCOUNTER — Ambulatory Visit: Payer: Self-pay | Admitting: Family Medicine

## 2023-11-13 ENCOUNTER — Encounter: Payer: Self-pay | Admitting: Physical Therapy

## 2023-11-13 DIAGNOSIS — R600 Localized edema: Secondary | ICD-10-CM

## 2023-11-13 DIAGNOSIS — M25572 Pain in left ankle and joints of left foot: Secondary | ICD-10-CM | POA: Diagnosis not present

## 2023-11-13 DIAGNOSIS — M2012 Hallux valgus (acquired), left foot: Secondary | ICD-10-CM | POA: Diagnosis not present

## 2023-11-13 DIAGNOSIS — R2689 Other abnormalities of gait and mobility: Secondary | ICD-10-CM | POA: Diagnosis not present

## 2023-11-13 DIAGNOSIS — M21612 Bunion of left foot: Secondary | ICD-10-CM | POA: Diagnosis not present

## 2023-11-13 DIAGNOSIS — M6281 Muscle weakness (generalized): Secondary | ICD-10-CM

## 2023-11-13 DIAGNOSIS — Q66229 Congenital metatarsus adductus, unspecified foot: Secondary | ICD-10-CM | POA: Diagnosis not present

## 2023-11-13 DIAGNOSIS — L749 Eccrine sweat disorder, unspecified: Secondary | ICD-10-CM | POA: Diagnosis not present

## 2023-11-13 LAB — COMPREHENSIVE METABOLIC PANEL WITH GFR
ALT: 18 U/L (ref 0–35)
AST: 19 U/L (ref 0–37)
Albumin: 4.9 g/dL (ref 3.5–5.2)
Alkaline Phosphatase: 82 U/L (ref 39–117)
BUN: 9 mg/dL (ref 6–23)
CO2: 27 meq/L (ref 19–32)
Calcium: 9.7 mg/dL (ref 8.4–10.5)
Chloride: 102 meq/L (ref 96–112)
Creatinine, Ser: 0.71 mg/dL (ref 0.40–1.20)
GFR: 117.99 mL/min (ref >60.00)
Glucose, Bld: 99 mg/dL (ref 70–99)
Potassium: 4.8 meq/L (ref 3.5–5.1)
Sodium: 138 meq/L (ref 135–145)
Total Bilirubin: 0.3 mg/dL (ref 0.2–1.2)
Total Protein: 7.8 g/dL (ref 6.0–8.3)

## 2023-11-13 LAB — CBC WITH DIFFERENTIAL/PLATELET
Basophils Absolute: 0 K/uL (ref 0.0–0.1)
Basophils Relative: 0.7 % (ref 0.0–3.0)
Eosinophils Absolute: 0.1 K/uL (ref 0.0–0.7)
Eosinophils Relative: 2.2 % (ref 0.0–5.0)
HCT: 44.9 % (ref 36.0–46.0)
Hemoglobin: 15 g/dL (ref 12.0–15.0)
Lymphocytes Relative: 32.7 % (ref 12.0–46.0)
Lymphs Abs: 2.2 K/uL (ref 0.7–4.0)
MCHC: 33.3 g/dL (ref 30.0–36.0)
MCV: 89 fl (ref 78.0–100.0)
Monocytes Absolute: 0.6 K/uL (ref 0.1–1.0)
Monocytes Relative: 8.6 % (ref 3.0–12.0)
Neutro Abs: 3.7 K/uL (ref 1.4–7.7)
Neutrophils Relative %: 55.8 % (ref 43.0–77.0)
Platelets: 387 K/uL (ref 150.0–400.0)
RBC: 5.04 Mil/uL (ref 3.87–5.11)
RDW: 12.8 % (ref 11.5–15.5)
WBC: 6.6 K/uL (ref 4.0–10.5)

## 2023-11-13 NOTE — Therapy (Signed)
 OUTPATIENT PHYSICAL THERAPY LOWER EXTREMITY TREATMENT   Patient Name: FUSAKO TANABE MRN: [id number] DOB:03/29/1995, 28 y.o., female Today's Date: 11/13/2023   END OF SESSION:  PT End of Session - 11/13/23 0801     Visit Number 3    Date for PT Re-Evaluation 01/05/24    Authorization Type [employer] & [employer]    PT Start Time 0801    PT Stop Time 0845    PT Time Calculation (min) 44 min    Activity Tolerance Patient tolerated treatment well    Behavior During Therapy [facility] for tasks assessed/performed           Past Medical History:  Diagnosis Date   Anxiety    Anxiety disorder of adolescence 06/09/2015   Arm fracture    Asthma    Constipation    alternates with diarrhea   Depression    Diarrhea    alternates with constipation   Ehlers-Danlos syndrome    per patient   GERD (gastroesophageal reflux disease)    Inflammatory bowel disease    Insomnia 06/09/2015   Past Surgical History:  Procedure Laterality Date   FOOT ARTHRODESIS Left 07/11/2023   Procedure: FUSION, JOINT, FOOT;  Surgeon: Silva Juliene SAUNDERS, DPM;  Location: [facility];  Service: Podiatry;  Laterality: Left;  MID FOOT FUSION   HALLUX VALGUS AKIN Left 07/11/2023   Procedure: CORRECTION, HALLUX VALGUS, WITH AKIN OSTEOTOMY;  Surgeon: Silva Juliene SAUNDERS, DPM;  Location: [facility];  Service: Podiatry;  Laterality: Left;  GENERAL WITH BLOCK   HALLUX VALGUS LAPIDUS Left 07/11/2023   Procedure: ROMAYNE LOOK;  Surgeon: Silva Juliene SAUNDERS, DPM;  Location: [facility];  Service: Podiatry;  Laterality: Left;   NO PAST SURGERIES     Patient Active Problem List   Diagnosis Date Noted   Metatarsus primus varus of left foot 07/13/2023   Acquired hallux valgus of left foot 07/13/2023   Metatarsus adductus of left foot 07/13/2023   Major osseous defect 07/13/2023   Otalgia of both ears 03/06/2023   Body aches 03/06/2023   Hypermobility syndrome 02/18/2023   Suicidal ideation 12/19/2015   MDD (major depressive  disorder), recurrent episode, severe (HCC) 12/18/2015   Insomnia 06/09/2015   Anxiety disorder of adolescence 06/09/2015   Severe episode of recurrent major depressive disorder, without psychotic features (HCC) 06/02/2015   Alternating constipation and diarrhea    Nausea & vomiting 09/29/2011   IBS (irritable bowel syndrome) 06/28/2011   GERD (gastroesophageal reflux disease) 06/28/2011   Back pain 08/01/2010   DYSURIA 08/17/2009   ACUTE PHARYNGITIS 04/06/2009   ACUTE BRONCHITIS 04/06/2009   Acute gastritis 04/27/2007   Anxiety state 02/11/2007   Allergic rhinitis 02/11/2007   GERD 12/25/2006   LOOSE STOOLS 12/25/2006   RUQ PAIN 12/25/2006    PCP: Antonio Cyndee Jamee SAUNDERS, DO   REFERRING PROVIDER: Silva Juliene SAUNDERS, DPM   REFERRING DIAG:  M20.12,M21.612 (ICD-10-CM) - Hallux valgus with bunions, left  Q66.229 (ICD-10-CM) - Metatarsus adductus, congenital   Postop bunionectomy 3 months ago difficulty ambulating and swelling in ankle, work on joint manipulation range of motion strengthening and gait training. Evaluate and treat modalities as needed at your discretion twice weekly for 6 to 8 weeks.   THERAPY DIAG:  Pain in left ankle and joints of left foot  Muscle weakness (generalized)  Other abnormalities of gait and mobility  Localized edema  RATIONALE FOR EVALUATION AND TREATMENT: Rehabilitation  ONSET DATE: 07/11/2023 Surgery/procedure:  LOOK Bunionectomy Correction metatarsus adductus with 2nd and  3rd TMT fusion with osteotomy Akin phalangeal osteotomy  NEXT MD VISIT: 11/20/23   SUBJECTIVE:                                                                                                                                                                                                         SUBJECTIVE STATEMENT: Kyona reports her scar has gone does significantly since she got the vitamin E and started the scar massage.  She reports she did her stretches this morning  which seems to loosen her ankle up - no pain currently.  PAIN: Are you having pain? No and Yes: NPRS scale: 0/10  Pain location: L ankle, but sometimes also in calf and foot  Pain description: sharp, burning  Aggravating factors: prolonged sitting or standing, walking more than a couple blocks  Relieving factors: elevation, rest, ice   PERTINENT HISTORY:  anxiety, depression, asthma, Ehlers-Danlos/hypermobility syndrome, IBS  PRECAUTIONS: None - No restrictions on activity. Regular shoe gear as tolerated.   RED FLAGS: None  WEIGHT BEARING RESTRICTIONS: No  FALLS:  Has patient fallen in last 6 months? Yes. 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   OBJECTIVE: (objective measures completed at initial evaluation unless otherwise dated)  DIAGNOSTIC FINDINGS:  Not available  PATIENT SURVEYS:  LEFS: 32 / 80 = 40.0 %, moderate functional limitation  COGNITION: Overall cognitive status: Within functional limits for tasks assessed    SENSATION: Continued numbness and tingling in L foot, ankle and calf since surgery (slightly better in calf)  EDEMA:  Figure 8: R = 48.0 cm, L = 50.0 cm    POSTURE:  B pes planus in standing  PALPATION: Decreased incisional scar mobility, most notable on medial incision Decreased mid and hindfoot mobility Increased muscle tension/tightness in L gastroc/soleus, fibularis muscles, and anterior tibialis  MUSCLE LENGTH:  Heelcord: Mild tight L>R  LOWER EXTREMITY ROM:  Active ROM Right  eval Left eval  Ankle dorsiflexion 12 1  Ankle plantarflexion 71 49  Ankle inversion 40 27  Ankle eversion 25 12   Passive ROM Left eval  Ankle dorsiflexion 6  Ankle plantarflexion  50  Ankle inversion 34  Ankle eversion 14  (Blank rows = not tested)  LOWER EXTREMITY MMT:  MMT Right eval Left eval  Hip flexion 5 4-  Hip extension 4+ 3+  Hip abduction 4+ 4-  Hip adduction 4+ 4+  Hip internal rotation 4+ 4-  Hip external rotation 4- 3+  Knee flexion 5 5  Knee extension 5 4+  Ankle dorsiflexion 5 3+  Ankle plantarflexion 4- (8 SLS HR) 2  Ankle inversion 5 3  Ankle eversion 5 3-   (Blank rows = not tested)  FUNCTIONAL TESTS:  5 times sit to stand: 20.25 sec  SLS: R = 37.66 sec, L = 10.78 sec  GAIT: Distance walked: Clinic distances Assistive device utilized: None Level of assistance: Complete Independence Gait pattern: decreased step length- Right, decreased stance time- Left, decreased ankle dorsiflexion- Left, antalgic, and lateral lean- Right   TODAY'S TREATMENT:   11/13/23 THERAPEUTIC EXERCISE: To improve strength and endurance.  Demonstration, verbal and tactile cues throughout for technique.  NuStep - L6 x 6 min (UE/LE) Seated self-anchored YTB ankle eversion 2 x 10 Seated self-anchored YTB ankle DF 2 x 10 Seated self-anchored YTB ankle inversion 2 x 10 Seated self-anchored GTB ankle PF 2 x 10  MANUAL THERAPY: To promote improved flexibility, improved joint mobility, increased ROM, and reduced pain utilizing joint mobilization.  L ankle subtalar and tarsal-metatarsal joint mobs - grade II-III  NEUROMUSCULAR RE-EDUCATION: To improve balance, coordination, kinesthesia, proprioception, and reduce fall risk. Seated BAPS - L3 DF/PF 2 x 10 (keeping sides level) IV/EV 2 x 10 (keeping front/back level) - more difficult CW/CCW x 10 SLS 2 x 30 L - intermittent light support from single pole   11/11/23  THERAPEUTIC EXERCISE: To improve strength, endurance, ROM, and  flexibility.  Demonstration, verbal and tactile cues throughout for technique.  S/L clams w/ YTB at distal thighs 2x10 B  Seated Resisted DF AROM w/ YTB 2x10 B  Seated Resisted PF AROM w/ YTB 2x10 B  Seated Resisted ankle eversion w/ YTB 2x10 B   NEURO RE-ED (corner balance) Static Stance x30  Static Stance with eyes closed x30- L ankle feels less steady  Staggered stance x30 B- more difficult w/LLE back  Tandem Stance x30 B- harder with LLE back  SLS x30 B- much more difficult on LLE, pain in front and lateral ankle   SELF CARE: Provided education for HEP review including recommended frequency for home performance.  Reviewed HEP w/ patient  Gastroc Stretch 2x30  Soleus Stretch 2x30  Ankle circles x10 ea direction Heel/Toe Raises x20    10/27/2023  SELF CARE:  Reviewed eval findings and role of PT in addressing identified deficits as well as instruction in initial HEP (see below).    PATIENT EDUCATION:  Education details: HEP review and HEP modification - self-anchored options for ankle TB exercises  Person educated: Patient Education method: Explanation, Demonstration, Verbal cues, and MedBridgeGO app updated Education comprehension: verbalized understanding, returned demonstration, verbal cues required, and needs further education  HOME EXERCISE PROGRAM: Access Code: [email or web address] URL: [email or web address] Date: 11/13/2023 Prepared by: Elijah Hidden  Exercises - Gastroc Stretch on Wall (Mirrored)  - 2-3 x daily - 7 x weekly - 3 reps - 30 sec hold - Standing Soleus Stretch  - 2-3 x daily - 7 x weekly - 3 reps - 30 sec hold - Ankle Circles in Elevation  - 2-3 x daily - 7 x weekly - 2 sets - 10 reps - Seated  Heel Toe Raises  - 2-3 x daily - 7 x weekly - 2 sets - 10 reps - 3 sec hold - Clamshell with Resistance  - 1 x daily - 7 x weekly - 2 sets - 10 reps - Long Sitting Ankle Plantar Flexion with Resistance  - 1 x daily - 7 x weekly - 2 sets - 10 reps - 3 sec  hold - Seated Ankle Dorsiflexion with Resistance  - 1 x daily - 7 x weekly - 2 sets - 10 reps - 3 sec hold - Seated Ankle Eversion with Resistance  - 1 x daily - 7 x weekly - 2 sets - 10 reps - 3 sec hold - Seated Ankle Inversion with Resistance  - 1 x daily - 7 x weekly - 2 sets - 10 reps - 3 sec hold   ASSESSMENT:  CLINICAL IMPRESSION: Mimi reports the scar massage seems to have reduce the puffiness of her incision and is allowing the skin to move more freely.  Reviewed TB resisted ankle strengthening, providing instruction in how to anchor the band for herself as she states her husband is reluctant to assist.  She notes stiffness restriction with GTB resisted PF which was addressed with joint mobs with patient noting increased ease of mobility following MT.  Introduced Best boy with BAPS platform in sitting - increased difficulty with lateral and circular motions but fatigue noted with all motions.  SLS remains challenging but improving.  Hanah will benefit from continued skilled PT to address ongoing ROM, strength, proprioception and balance deficits to improve mobility and activity tolerance with decreased pain interference.   Dion reports that she has noticed improvement in scar tissue and overall ankle mobility with walking. Her HEP is going well and she has been consistent even when her ankle stiffens up at times still. During today's session, pt was able to move the left ankle better during all activities reporting that she has noticed improved ROM already with her current HEP. Began gentle LE strengthening exercises with focus on ankle strengthening. Pt was introduced to light resistance and stated that with eversion she feels it is more challenging to move her ankle as she needed to use more knee and hip to complete the motion. Pt's incisions are healing well and swelling has decreased. Pt was also introduced to corner balance exercises and reported difficulty with L SLS,  staggered stance, and tandem stance when the LLE needed to hold and maintain more of her weight. She reported some pain/discomfort with L SLS in particular. Pt is aware of new additions to HEP and expressed she is looking forward to therapy as she already sees improvement. Haydon will continue to benefit from skilled PT interventions addressing deficits to improve mobility and activity tolerance w/ dec'd pain interference.     EVAL: Valley L Kocher is a 28 y.o. female who was referred to physical therapy for evaluation and treatment s/p correction hallux valgus left foot with metatarsus adductus correction and Akin osteotomy on 07/11/23.  Patient has been released from any postop restrictions and cleared to resume normal activity including running as tolerated.   Patient reports L foot ankle and calf pain persist since surgery.  Pain is worse with prolonged sitting or standing, or walking long distances.  Patient has deficits in L foot/ankle ROM, distal L LE flexibility, L>R LE strength, abnormal posture, and TTP with abnormal muscle tension which are interfering with ADLs and are impacting quality of life.  On LEFS patient scored  32/80 demonstrating moderate functional limitation.  Keron will benefit from skilled PT to address above deficits to improve mobility and activity tolerance with decreased pain interference prior to proceeding with surgery for R foot.  OBJECTIVE IMPAIRMENTS: Abnormal gait, decreased activity tolerance, decreased balance, decreased endurance, decreased knowledge of condition, decreased mobility, difficulty walking, decreased ROM, decreased strength, hypomobility, increased edema, increased fascial restrictions, impaired perceived functional ability, increased muscle spasms, impaired flexibility, impaired sensation, improper body mechanics, postural dysfunction, pain, and hypermobility.   ACTIVITY LIMITATIONS: carrying, lifting, bending, sitting, standing, squatting, sleeping,  stairs, transfers, bathing, locomotion level, and caring for others  PARTICIPATION LIMITATIONS: meal prep, cleaning, laundry, shopping, community activity, occupation, and yard work  PERSONAL FACTORS: Past/current experiences, Profession, Time since onset of injury/illness/exacerbation, and 3+ comorbidities: anxiety, depression, asthma, Ehlers-Danlos/hypermobility syndrome, IBS are also affecting patient's functional outcome.   REHAB POTENTIAL: Good  CLINICAL DECISION MAKING: Evolving/moderate complexity  EVALUATION COMPLEXITY: Moderate   GOALS: Goals reviewed with patient? Yes  SHORT TERM GOALS: Target date: 12/01/2023  Patient will be independent with initial HEP. Baseline: Initial HEP provided on eval Goal status: MET - 11/11/23 - pt is doing HEP consistently   2.  Patient will report at least 25% improvement in L foot/ankle/calf pain to improve QOL. Baseline: 2/10 on eval, up to 8/10 Goal status: INITIAL  3.  Patient will improve 5x STS time to </= 15 seconds to demonstrate improved functional strength and transfer efficiency.  Baseline: 20.25 sec Goal status: INITIAL   LONG TERM GOALS: Target date: 01/05/2024  Patient will be independent with advanced/ongoing HEP to improve outcomes and carryover.  Baseline:  Goal status: INITIAL  2.  Patient will report at least 50-75% improvement in L foot/ankle/calf pain to improve QOL. Baseline: 2/10 on eval, up to 8/10 Goal status: INITIAL  3.  Patient will demonstrate improved L ankle AROM to [facility] to allow for normal gait and stair mechanics. Baseline: Refer to above LE ROM table Goal status: INITIAL  4.  Patient will demonstrate improved B LE strength to >/= 4+/5 with exception of L PF to >/= 3+/5 for improved stability and ease of mobility. Baseline: Refer to above LE MMT table Goal status: INITIAL  5.  Patient will be able to ambulate ~1 mile with normal gait pattern without increased foot/ankle pain to access community.   Baseline: Antalgic gait pattern with gait distance limited to only a few blocks Goal status: INITIAL  6. Patient will be able to ascend/descend stairs with 1 HR and reciprocal step pattern safely to access home and community.  Baseline:  Goal status: INITIAL  7.  Patient will report >/= 45/80 on LEFS to demonstrate improved functional ability. Baseline: 32 / 80 = 40.0 % Goal status: INITIAL  8.  Patient will improve L SLS to >/= 20 sec to demonstrate improved L ankle stability. Baseline: 10.78 sec Goal status: IN PROGRESS - 11/13/23 - L SLS with 2 x 30 with intermittent light 1 pole    PLAN:  PT FREQUENCY: 2x/week  PT DURATION: 8-10 weeks   PLANNED INTERVENTIONS: 02835- PT Re-evaluation, 97750- Physical Performance Testing, 97110-Therapeutic exercises, 97530- Therapeutic activity, 97112- Neuromuscular re-education, 97535- Self Care, 02859- Manual therapy, [id number]- Gait training, [phone]- Aquatic Therapy, [phone]- Electrical stimulation (unattended), [id number]- Vasopneumatic device, [id number]- Ultrasound, [id number]- Ionotophoresis 4mg /ml Dexamethasone , 79439 (1-2 muscles), 20561 (3+ muscles)- Dry Needling, Patient/Family education, Balance training, Stair training, Taping, Joint mobilization, Scar mobilization, DME instructions, Cryotherapy, and Moist heat  PLAN FOR NEXT SESSION: MT including STM/joint mobilizations  PRN to reduce edema and improve L ankle ROM; L ankle P/AROM; gently progress L ankle and proximal LE strengthening - update HEP accordingly; continue proprioceptive and balance training   Elijah CHRISTELLA Hidden, PT 11/13/2023, 8:56 AM

## 2023-11-14 LAB — THYROID PANEL WITH TSH
Free Thyroxine Index: 2.1 (ref 1.4–3.8)
T3 Uptake: 28 % (ref 22–35)
T4, Total: 7.4 ug/dL (ref 5.1–11.9)
TSH: 0.85 m[IU]/L

## 2023-11-17 ENCOUNTER — Encounter: Admitting: Physical Therapy

## 2023-11-17 DIAGNOSIS — F411 Generalized anxiety disorder: Secondary | ICD-10-CM | POA: Diagnosis not present

## 2023-11-18 ENCOUNTER — Ambulatory Visit

## 2023-11-18 DIAGNOSIS — R2689 Other abnormalities of gait and mobility: Secondary | ICD-10-CM

## 2023-11-18 DIAGNOSIS — M25572 Pain in left ankle and joints of left foot: Secondary | ICD-10-CM | POA: Diagnosis not present

## 2023-11-18 DIAGNOSIS — M6281 Muscle weakness (generalized): Secondary | ICD-10-CM | POA: Diagnosis not present

## 2023-11-18 DIAGNOSIS — M21612 Bunion of left foot: Secondary | ICD-10-CM | POA: Diagnosis not present

## 2023-11-18 DIAGNOSIS — M2012 Hallux valgus (acquired), left foot: Secondary | ICD-10-CM | POA: Diagnosis not present

## 2023-11-18 DIAGNOSIS — R600 Localized edema: Secondary | ICD-10-CM | POA: Diagnosis not present

## 2023-11-18 DIAGNOSIS — Q66229 Congenital metatarsus adductus, unspecified foot: Secondary | ICD-10-CM | POA: Diagnosis not present

## 2023-11-18 NOTE — Therapy (Signed)
 OUTPATIENT PHYSICAL THERAPY LOWER EXTREMITY TREATMENT   Patient Name: Dana [name]: [id number] DOB:[birth year], 28 y.o., female Today's Date: 11/18/2023   END OF SESSION:  PT End of Session - 11/18/23 0808     Visit Number 4    Date for PT Re-Evaluation 01/05/24    Authorization Type [employer] & [employer]    PT Start Time 0805    PT Stop Time 0845    PT Time Calculation (min) 40 min    Activity Tolerance Patient tolerated treatment well    Behavior During Therapy [facility] for tasks assessed/performed           Past Medical History:  Diagnosis Date   Anxiety    Anxiety disorder of adolescence 06/09/2015   Arm fracture    Asthma    Constipation    alternates with diarrhea   Depression    Diarrhea    alternates with constipation   Ehlers-Danlos syndrome    per patient   GERD (gastroesophageal reflux disease)    Inflammatory bowel disease    Insomnia 06/09/2015   Past Surgical History:  Procedure Laterality Date   FOOT ARTHRODESIS Left 07/11/2023   Procedure: FUSION, JOINT, FOOT;  Surgeon: Silva Juliene SAUNDERS, DPM;  Location: [facility];  Service: Podiatry;  Laterality: Left;  MID FOOT FUSION   HALLUX VALGUS AKIN Left 07/11/2023   Procedure: CORRECTION, HALLUX VALGUS, WITH AKIN OSTEOTOMY;  Surgeon: Silva Juliene SAUNDERS, DPM;  Location: [facility];  Service: Podiatry;  Laterality: Left;  GENERAL WITH BLOCK   HALLUX VALGUS LAPIDUS Left 07/11/2023   Procedure: ROMAYNE LOOK;  Surgeon: Silva Juliene SAUNDERS, DPM;  Location: [facility];  Service: Podiatry;  Laterality: Left;   NO PAST SURGERIES     Patient Active Problem List   Diagnosis Date Noted   Metatarsus primus varus of left foot 07/13/2023   Acquired hallux valgus of left foot 07/13/2023   Metatarsus adductus of left foot 07/13/2023   Major osseous defect 07/13/2023   Otalgia of both ears 03/06/2023   Body aches 03/06/2023   Hypermobility syndrome 02/18/2023   Suicidal ideation 12/19/2015   MDD (major depressive  disorder), recurrent episode, severe (HCC) 12/18/2015   Insomnia 06/09/2015   Anxiety disorder of adolescence 06/09/2015   Severe episode of recurrent major depressive disorder, without psychotic features (HCC) 06/02/2015   Alternating constipation and diarrhea    Nausea & vomiting 09/29/2011   IBS (irritable bowel syndrome) 06/28/2011   GERD (gastroesophageal reflux disease) 06/28/2011   Back pain 08/01/2010   DYSURIA 08/17/2009   ACUTE PHARYNGITIS 04/06/2009   ACUTE BRONCHITIS 04/06/2009   Acute gastritis 04/27/2007   Anxiety state 02/11/2007   Allergic rhinitis 02/11/2007   GERD 12/25/2006   LOOSE STOOLS 12/25/2006   RUQ PAIN 12/25/2006    PCP: Antonio Cyndee Jamee SAUNDERS, DO   REFERRING PROVIDER: Silva Juliene SAUNDERS, DPM   REFERRING DIAG:  M20.12,M21.612 (ICD-10-CM) - Hallux valgus with bunions, left  Q66.229 (ICD-10-CM) - Metatarsus adductus, congenital   Postop bunionectomy 3 months ago difficulty ambulating and swelling in ankle, work on joint manipulation range of motion strengthening and gait training. Evaluate and treat modalities as needed at your discretion twice weekly for 6 to 8 weeks.   THERAPY DIAG:  Pain in left ankle and joints of left foot  Muscle weakness (generalized)  Other abnormalities of gait and mobility  Localized edema  RATIONALE FOR EVALUATION AND TREATMENT: Rehabilitation  ONSET DATE: 07/11/2023 Surgery/procedure:  LOOK Bunionectomy Correction metatarsus adductus with 2nd and  3rd TMT fusion with osteotomy Akin phalangeal osteotomy  NEXT MD VISIT: 11/20/23   SUBJECTIVE:                                                                                                                                                                                                         SUBJECTIVE STATEMENT: Pt reports therapy helping so far, more mobility and strength so far  PAIN: Are you having pain? No and Yes: NPRS scale: 3/10  Pain location: L ankle, but  sometimes also in calf and foot  Pain description: sharp, burning  Aggravating factors: prolonged sitting or standing, walking more than a couple blocks  Relieving factors: elevation, rest, ice   PERTINENT HISTORY:  anxiety, depression, asthma, Ehlers-Danlos/hypermobility syndrome, IBS  PRECAUTIONS: None - No restrictions on activity. Regular shoe gear as tolerated.   RED FLAGS: None  WEIGHT BEARING RESTRICTIONS: No  FALLS:  Has patient fallen in last 6 months? Yes. 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   OBJECTIVE: (objective measures completed at initial evaluation unless otherwise dated)  DIAGNOSTIC FINDINGS:  Not available  PATIENT SURVEYS:  LEFS: 32 / 80 = 40.0 %, moderate functional limitation  COGNITION: Overall cognitive status: Within functional limits for tasks assessed    SENSATION: Continued numbness and tingling in L foot, ankle and calf since surgery (slightly better in calf)  EDEMA:  Figure 8: R = 48.0 cm, L = 50.0 cm    POSTURE:  B pes planus in standing  PALPATION: Decreased incisional scar mobility, most notable on medial incision Decreased mid and hindfoot mobility Increased muscle tension/tightness in L gastroc/soleus, fibularis muscles, and anterior tibialis  MUSCLE LENGTH:  Heelcord: Mild tight L>R  LOWER EXTREMITY ROM:  Active ROM Right eval Left eval  Ankle dorsiflexion 12 1  Ankle plantarflexion 71 49  Ankle inversion 40 27  Ankle eversion 25 12   Passive ROM Left eval   Ankle dorsiflexion 6  Ankle plantarflexion 50  Ankle inversion 34  Ankle eversion 14  (Blank rows = not tested)  LOWER EXTREMITY MMT:  MMT Right eval Left eval  Hip  flexion 5 4-  Hip extension 4+ 3+  Hip abduction 4+ 4-  Hip adduction 4+ 4+  Hip internal rotation 4+ 4-  Hip external rotation 4- 3+  Knee flexion 5 5  Knee extension 5 4+  Ankle dorsiflexion 5 3+  Ankle plantarflexion 4- (8 SLS HR) 2  Ankle inversion 5 3  Ankle eversion 5 3-   (Blank rows = not tested)  FUNCTIONAL TESTS:  5 times sit to stand: 20.25 sec  SLS: R = 37.66 sec, L = 10.78 sec  GAIT: Distance walked: Clinic distances Assistive device utilized: None Level of assistance: Complete Independence Gait pattern: decreased step length- Right, decreased stance time- Left, decreased ankle dorsiflexion- Left, antalgic, and lateral lean- Right   TODAY'S TREATMENT:  11/18/23 THERAPEUTIC EXERCISE: To improve strength and endurance.  Demonstration, verbal and tactile cues throughout for technique.  NuStep - L6 x 6 min (UE/LE) Seated self-anchored YTB ankle eversion 2 x 10 Seated self-anchored YTB ankle DF 2 x 10 Seated self-anchored YTB ankle inversion 2 x 10 Seated self-anchored GTB ankle PF 2 x 10  NEUROMUSCULAR RE-EDUCATION: To improve balance, coordination, kinesthesia, proprioception, and reduce fall risk. Seated BAPS - L3 DF/PF 2 x 10 (keeping sides level) 2.5 weights on lateral, then medial side x 10 each IV/EV 2 x 10 - more difficult CW/CCW x 20 SLS 2 x 30 L - no UE support Standing L DF/knee flexion mobilization on slant board 5x3  11/13/23 THERAPEUTIC EXERCISE: To improve strength and endurance.  Demonstration, verbal and tactile cues throughout for technique.  NuStep - L6 x 6 min (UE/LE) Seated self-anchored YTB ankle eversion 2 x 10 Seated self-anchored YTB ankle DF 2 x 10 Seated self-anchored YTB ankle inversion 2 x 10 Seated self-anchored GTB ankle PF 2 x 10  MANUAL THERAPY: To  promote improved flexibility, improved joint mobility, increased ROM, and reduced pain utilizing joint mobilization.  L ankle subtalar and tarsal-metatarsal joint mobs - grade II-III  NEUROMUSCULAR RE-EDUCATION: To improve balance, coordination, kinesthesia, proprioception, and reduce fall risk. Seated BAPS - L3 DF/PF 2 x 10 (keeping sides level) IV/EV 2 x 10 (keeping front/back level) - more difficult CW/CCW x 10 SLS 2 x 30 L - intermittent light support from single pole   11/11/23  THERAPEUTIC EXERCISE: To improve strength, endurance, ROM, and flexibility.  Demonstration, verbal and tactile cues throughout for technique.  S/L clams w/ YTB at distal thighs 2x10 B  Seated Resisted DF AROM w/ YTB 2x10 B  Seated Resisted PF AROM w/ YTB 2x10 B  Seated Resisted ankle eversion w/ YTB 2x10 B   NEURO RE-ED (corner balance) Static Stance x30  Static Stance with eyes closed x30- L ankle feels less steady  Staggered stance x30 B- more difficult w/LLE back  Tandem Stance x30 B- harder with LLE back  SLS x30 B- much more difficult on LLE, pain in front and lateral ankle   SELF CARE: Provided education for HEP review including recommended frequency for home performance.  Reviewed HEP w/ patient  Gastroc Stretch 2x30  Soleus Stretch 2x30  Ankle circles x10 ea direction Heel/Toe Raises x20    10/27/2023  SELF CARE:  Reviewed eval findings and role of PT in addressing identified deficits as well as instruction in initial HEP (see below).    PATIENT EDUCATION:  Education details: HEP review and HEP modification - self-anchored options for ankle TB exercises  Person educated: Patient Education method: Explanation, Demonstration, Verbal cues, and MedBridgeGO app updated Education comprehension:  verbalized understanding, returned demonstration, verbal cues required, and needs further education  HOME EXERCISE PROGRAM: Access Code: [email or web address] URL: [email or web address] Date:  11/13/2023 Prepared by: Elijah Hidden  Exercises - Gastroc Stretch on Wall (Mirrored)  - 2-3 x daily - 7 x weekly - 3 reps - 30 sec hold - Standing Soleus Stretch  - 2-3 x daily - 7 x weekly - 3 reps - 30 sec hold - Ankle Circles in Elevation  - 2-3 x daily - 7 x weekly - 2 sets - 10 reps - Seated Heel Toe Raises  - 2-3 x daily - 7 x weekly - 2 sets - 10 reps - 3 sec hold - Clamshell with Resistance  - 1 x daily - 7 x weekly - 2 sets - 10 reps - Long Sitting Ankle Plantar Flexion with Resistance  - 1 x daily - 7 x weekly - 2 sets - 10 reps - 3 sec hold - Seated Ankle Dorsiflexion with Resistance  - 1 x daily - 7 x weekly - 2 sets - 10 reps - 3 sec hold - Seated Ankle Eversion with Resistance  - 1 x daily - 7 x weekly - 2 sets - 10 reps - 3 sec hold - Seated Ankle Inversion with Resistance  - 1 x daily - 7 x weekly - 2 sets - 10 reps - 3 sec hold   ASSESSMENT:  CLINICAL IMPRESSION: Progressed with intrinsic ankle strengthening and proprioceptive training. She still has difficulty with EV/IV as well as circular motions on [employer]. Added weight to improve work on proprioceptive control and stability with movement. Jolean will benefit from continued skilled PT to address ongoing ROM, strength, proprioception and balance deficits to improve mobility and activity tolerance with decreased pain interference.   Marykathryn reports that she has noticed improvement in scar tissue and overall ankle mobility with walking. Her HEP is going well and she has been consistent even when her ankle stiffens up at times still. During today's session, pt was able to move the left ankle better during all activities reporting that she has noticed improved ROM already with her current HEP. Began gentle LE strengthening exercises with focus on ankle strengthening. Pt was introduced to light resistance and stated that with eversion she feels it is more challenging to move her ankle as she needed to use more knee and hip to  complete the motion. Pt's incisions are healing well and swelling has decreased. Pt was also introduced to corner balance exercises and reported difficulty with L SLS, staggered stance, and tandem stance when the LLE needed to hold and maintain more of her weight. She reported some pain/discomfort with L SLS in particular. Pt is aware of new additions to HEP and expressed she is looking forward to therapy as she already sees improvement. Liliya will continue to benefit from skilled PT interventions addressing deficits to improve mobility and activity tolerance w/ dec'd pain interference.     EVAL: Niki L Focht is a 28 y.o. female who was referred to physical therapy for evaluation and treatment s/p correction hallux valgus left foot with metatarsus adductus correction and Akin osteotomy on 07/11/23.  Patient has been released from any postop restrictions and cleared to resume normal activity including running as tolerated.   Patient reports L foot ankle and calf pain persist since surgery.  Pain is worse with prolonged sitting or standing, or walking long distances.  Patient has deficits in L foot/ankle ROM, distal L LE flexibility, L>R LE strength,  abnormal posture, and TTP with abnormal muscle tension which are interfering with ADLs and are impacting quality of life.  On LEFS patient scored 32/80 demonstrating moderate functional limitation.  Jennilyn will benefit from skilled PT to address above deficits to improve mobility and activity tolerance with decreased pain interference prior to proceeding with surgery for R foot.  OBJECTIVE IMPAIRMENTS: Abnormal gait, decreased activity tolerance, decreased balance, decreased endurance, decreased knowledge of condition, decreased mobility, difficulty walking, decreased ROM, decreased strength, hypomobility, increased edema, increased fascial restrictions, impaired perceived functional ability, increased muscle spasms, impaired flexibility, impaired sensation,  improper body mechanics, postural dysfunction, pain, and hypermobility.   ACTIVITY LIMITATIONS: carrying, lifting, bending, sitting, standing, squatting, sleeping, stairs, transfers, bathing, locomotion level, and caring for others  PARTICIPATION LIMITATIONS: meal prep, cleaning, laundry, shopping, community activity, occupation, and yard work  PERSONAL FACTORS: Past/current experiences, Profession, Time since onset of injury/illness/exacerbation, and 3+ comorbidities: anxiety, depression, asthma, Ehlers-Danlos/hypermobility syndrome, IBS are also affecting patient's functional outcome.   REHAB POTENTIAL: Good  CLINICAL DECISION MAKING: Evolving/moderate complexity  EVALUATION COMPLEXITY: Moderate   GOALS: Goals reviewed with patient? Yes  SHORT TERM GOALS: Target date: 12/01/2023  Patient will be independent with initial HEP. Baseline: Initial HEP provided on eval Goal status: MET - 11/11/23 - pt is doing HEP consistently   2.  Patient will report at least 25% improvement in L foot/ankle/calf pain to improve QOL. Baseline: 2/10 on eval, up to 8/10 Goal status: MET- 11/18/23  3.  Patient will improve 5x STS time to </= 15 seconds to demonstrate improved functional strength and transfer efficiency.  Baseline: 20.25 sec Goal status: INITIAL   LONG TERM GOALS: Target date: 01/05/2024  Patient will be independent with advanced/ongoing HEP to improve outcomes and carryover.  Baseline:  Goal status: INITIAL  2.  Patient will report at least 50-75% improvement in L foot/ankle/calf pain to improve QOL. Baseline: 2/10 on eval, up to 8/10 Goal status: INITIAL  3.  Patient will demonstrate improved L ankle AROM to [facility] to allow for normal gait and stair mechanics. Baseline: Refer to above LE ROM table Goal status: INITIAL  4.  Patient will demonstrate improved B LE strength to >/= 4+/5 with exception of L PF to >/= 3+/5 for improved stability and ease of mobility. Baseline: Refer to  above LE MMT table Goal status: INITIAL  5.  Patient will be able to ambulate ~1 mile with normal gait pattern without increased foot/ankle pain to access community.  Baseline: Antalgic gait pattern with gait distance limited to only a few blocks Goal status: INITIAL  6. Patient will be able to ascend/descend stairs with 1 HR and reciprocal step pattern safely to access home and community.  Baseline:  Goal status: INITIAL  7.  Patient will report >/= 45/80 on LEFS to demonstrate improved functional ability. Baseline: 32 / 80 = 40.0 % Goal status: INITIAL  8.  Patient will improve L SLS to >/= 20 sec to demonstrate improved L ankle stability. Baseline: 10.78 sec Goal status: IN PROGRESS - 11/13/23 - L SLS with 2 x 30 with intermittent light 1 pole    PLAN:  PT FREQUENCY: 2x/week  PT DURATION: 8-10 weeks   PLANNED INTERVENTIONS: 97164- PT Re-evaluation, 97750- Physical Performance Testing, 97110-Therapeutic exercises, 97530- Therapeutic activity, [id number]- Neuromuscular re-education, 97535- Self Care, [id number]- Manual therapy, [id number]- Gait training, [phone]- Aquatic Therapy, [phone]- Electrical stimulation (unattended), 97016- Vasopneumatic device, [id number]- Ultrasound, [id number]- Ionotophoresis 4mg /ml Dexamethasone , 79439 (1-2 muscles), 20561 (3+ muscles)- Dry Needling, Patient/Family  education, Balance training, Stair training, Taping, Joint mobilization, Scar mobilization, DME instructions, Cryotherapy, and Moist heat  PLAN FOR NEXT SESSION: MT including STM/joint mobilizations PRN to reduce edema and improve L ankle ROM; L ankle P/AROM; gently progress L ankle and proximal LE strengthening - update HEP accordingly; continue proprioceptive and balance training   Sol LITTIE Gaskins, PTA 11/18/2023, 8:48 AM

## 2023-11-20 ENCOUNTER — Ambulatory Visit (INDEPENDENT_AMBULATORY_CARE_PROVIDER_SITE_OTHER)

## 2023-11-20 ENCOUNTER — Ambulatory Visit (INDEPENDENT_AMBULATORY_CARE_PROVIDER_SITE_OTHER): Admitting: Podiatry

## 2023-11-20 VITALS — Ht 63.0 in | Wt 153.0 lb

## 2023-11-20 DIAGNOSIS — M2012 Hallux valgus (acquired), left foot: Secondary | ICD-10-CM

## 2023-11-20 DIAGNOSIS — M21612 Bunion of left foot: Secondary | ICD-10-CM

## 2023-11-20 NOTE — Progress Notes (Signed)
  Subjective:  Patient ID: Dana [name], female    DOB: 05-May-1995,  MRN: [id number]  Chief Complaint  Patient presents with   Post-op Follow-up    RM 10 Return in about 8 weeks (around 11/20/2023) for Follow-up left foot surgery and PT. Patient states tingling/ and sharp pain on top of left foot. Patient states numbness in left hallux.  Patients intense pain during physical therapy.    DOS: 07/11/2023 Procedure: Correction hallux valgus left foot with metatarsus adductus correction and Akin osteotomy  28 y.o. female returns for post-op check.  Overall doing well in regular shoes does note occasional pain after physical therapy or if she has been on her feet for a long time  Review of Systems: Negative except as noted in the HPI. Denies N/V/F/Ch.   Objective:  There were no vitals filed for this visit. Body mass index is 27.1 kg/m. Constitutional Well developed. Well nourished.  Vascular Foot warm and well perfused. Capillary refill normal to all digits.  Calf is soft and supple, no posterior calf or knee pain, negative Homans' sign  Neurologic Normal speech. Oriented to person, place, and time. Epicritic sensation to light touch grossly present bilaterally.  Dermatologic Incision is well-healed not hypertrophic  Orthopedic: Minimal edema in forefoot.  Good range of motion of MTP joint.  Mild tenderness on talonavicular and anterior ankle.  Hallux valgus and metatarsus adductus deformity on the right foot.   Multiple view plain film radiographs: Correction is maintained without complication, no bony abnormality in the area of the hindfoot  Her right foot radiographs reviewed today previously taken shows hallux valgus Forni metatarsus adductus deformity Assessment:   1. Hallux valgus with bunions, left    Plan:  Patient was evaluated and treated and all questions answered.  S/p foot surgery left - Doing well in physical therapy is helping quite a bit.  He will continue  therapy as long as she needs to.  Return in 2 months to reevaluate.  She will let me know when she is ready proceed with right foot surgery.   She is not quite ready to schedule right foot surgery she will call when she is ready to schedule it she wants this 1 to be 100% as well as time with a new job she is starting   Surgical plan:  Procedure: - Right foot Lapidus bunionectomy, metatarsal adductus correction with osteotomy and midfoot fusion, Akin osteotomy, possible bone graft  Location: - [facility]  Anesthesia plan: - General With regional block  Postoperative pain plan: - Tylenol  1000 mg every 6 hours, ibuprofen  600 mg every 6 hours, gabapentin  300 mg every 8 hours x5 days, oxycodone  5 mg 1-2 tabs every 6 hours only as needed  DVT prophylaxis: - Aspirin  325 mg twice daily  WB Restrictions / DME needs: - Nonweightbearing in splint postop with knee scooter.    Return in about 2 months (around 01/20/2024) for surgery follow up (new xrays).

## 2023-11-21 ENCOUNTER — Ambulatory Visit

## 2023-11-21 DIAGNOSIS — M25572 Pain in left ankle and joints of left foot: Secondary | ICD-10-CM | POA: Diagnosis not present

## 2023-11-21 DIAGNOSIS — M6281 Muscle weakness (generalized): Secondary | ICD-10-CM | POA: Diagnosis not present

## 2023-11-21 DIAGNOSIS — M21612 Bunion of left foot: Secondary | ICD-10-CM | POA: Diagnosis not present

## 2023-11-21 DIAGNOSIS — R2689 Other abnormalities of gait and mobility: Secondary | ICD-10-CM

## 2023-11-21 DIAGNOSIS — M2012 Hallux valgus (acquired), left foot: Secondary | ICD-10-CM | POA: Diagnosis not present

## 2023-11-21 DIAGNOSIS — Q66229 Congenital metatarsus adductus, unspecified foot: Secondary | ICD-10-CM | POA: Diagnosis not present

## 2023-11-21 DIAGNOSIS — R600 Localized edema: Secondary | ICD-10-CM

## 2023-11-21 NOTE — Therapy (Signed)
 OUTPATIENT PHYSICAL THERAPY LOWER EXTREMITY TREATMENT   Patient Name: Dana [name] MRN: [id number] DOB:[birth year], 28 y.o., female Today's Date: 11/21/2023   END OF SESSION:  PT End of Session - 11/21/23 0806     Visit Number 5    Date for PT Re-Evaluation 01/05/24    Authorization Type BCBS & [facility] Medicaid    PT Start Time 0803    PT Stop Time 0846    PT Time Calculation (min) 43 min    Activity Tolerance Patient tolerated treatment well    Behavior During Therapy [facility] for tasks assessed/performed           Past Medical History:  Diagnosis Date   Anxiety    Anxiety disorder of adolescence 06/09/2015   Arm fracture    Asthma    Constipation    alternates with diarrhea   Depression    Diarrhea    alternates with constipation   Ehlers-Danlos syndrome    per patient   GERD (gastroesophageal reflux disease)    Inflammatory bowel disease    Insomnia 06/09/2015   Past Surgical History:  Procedure Laterality Date   FOOT ARTHRODESIS Left 07/11/2023   Procedure: FUSION, JOINT, FOOT;  Surgeon: Silva Juliene SAUNDERS, DPM;  Location: [facility] ORS;  Service: Podiatry;  Laterality: Left;  MID FOOT FUSION   HALLUX VALGUS AKIN Left 07/11/2023   Procedure: CORRECTION, HALLUX VALGUS, WITH AKIN OSTEOTOMY;  Surgeon: Silva Juliene SAUNDERS, DPM;  Location: [facility] OR[facility];  Service: Podiatry;  Laterality: Left;  GENERAL WITH BLOCK   HALLUX VALGUS LAPIDUS Left 07/11/2023   Procedure: ROMAYNE LOOK;  Surgeon: Silva Juliene SAUNDERS, DPM;  Location: [facility] OR[facility];  Service: Podiatry;  Laterality: Left;   NO PAST SURGERIES     Patient Active Problem List   Diagnosis Date Noted   Metatarsus primus varus of left foot 07/13/2023   Acquired hallux valgus of left foot 07/13/2023   Metatarsus adductus of left foot 07/13/2023   Major osseous defect 07/13/2023   Otalgia of both ears 03/06/2023   Body aches 03/06/2023   Hypermobility syndrome 02/18/2023   Suicidal ideation 12/19/2015   MDD (major depressive  disorder), recurrent episode, severe (HCC) 12/18/2015   Insomnia 06/09/2015   Anxiety disorder of adolescence 06/09/2015   Severe episode of recurrent major depressive disorder, without psychotic features (HCC) 06/02/2015   Alternating constipation and diarrhea    Nausea & vomiting 09/29/2011   IBS (irritable bowel syndrome) 06/28/2011   GERD (gastroesophageal reflux disease) 06/28/2011   Back pain 08/01/2010   DYSURIA 08/17/2009   ACUTE PHARYNGITIS 04/06/2009   ACUTE BRONCHITIS 04/06/2009   Acute gastritis 04/27/2007   Anxiety state 02/11/2007   Allergic rhinitis 02/11/2007   GERD 12/25/2006   LOOSE STOOLS 12/25/2006   RUQ PAIN 12/25/2006    PCP: Antonio Cyndee Jamee SAUNDERS, DO   REFERRING PROVIDER: Silva Juliene SAUNDERS, DPM   REFERRING DIAG:  M20.12,M21.612 (ICD-10-CM) - Hallux valgus with bunions, left  Q66.229 (ICD-10-CM) - Metatarsus adductus, congenital   Postop bunionectomy 3 months ago difficulty ambulating and swelling in ankle, work on joint manipulation range of motion strengthening and gait training. Evaluate and treat modalities as needed at your discretion twice weekly for 6 to 8 weeks.   THERAPY DIAG:  Pain in left ankle and joints of left foot  Muscle weakness (generalized)  Other abnormalities of gait and mobility  Localized edema  RATIONALE FOR EVALUATION AND TREATMENT: Rehabilitation  ONSET DATE: 07/11/2023 Surgery/procedure:  LOOK Bunionectomy Correction metatarsus adductus with 2nd and  3rd TMT fusion with osteotomy Akin phalangeal osteotomy  NEXT MD VISIT: 11/20/23   SUBJECTIVE:                                                                                                                                                                                                         SUBJECTIVE STATEMENT: Pt reports her surgeon was pleased, reports that he will do a steroid shot in her foot if nerve pain doesn't subside, or potentially go back in to decompress  the area if need  PAIN: Are you having pain? No and Yes: NPRS scale: 2/10  Pain location: L ankle, but sometimes also in calf and foot  Pain description: sharp, burning  Aggravating factors: prolonged sitting or standing, walking more than a couple blocks  Relieving factors: elevation, rest, ice   PERTINENT HISTORY:  anxiety, depression, asthma, Ehlers-Danlos/hypermobility syndrome, IBS  PRECAUTIONS: None - No restrictions on activity. Regular shoe gear as tolerated.   RED FLAGS: None  WEIGHT BEARING RESTRICTIONS: No  FALLS:  Has patient fallen in last 6 months? Yes. 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   OBJECTIVE: (objective measures completed at initial evaluation unless otherwise dated)  DIAGNOSTIC FINDINGS:  Not available  PATIENT SURVEYS:  LEFS: 32 / 80 = 40.0 %, moderate functional limitation  COGNITION: Overall cognitive status: Within functional limits for tasks assessed    SENSATION: Continued numbness and tingling in L foot, ankle and calf since surgery (slightly better in calf)  EDEMA:  Figure 8: R = 48.0 cm, L = 50.0 cm    POSTURE:  B pes planus in standing  PALPATION: Decreased incisional scar mobility, most notable on medial incision Decreased mid and hindfoot mobility Increased muscle tension/tightness in L gastroc/soleus, fibularis muscles, and anterior tibialis  MUSCLE LENGTH:  Heelcord: Mild tight L>R  LOWER EXTREMITY ROM:  Active ROM Right eval Left eval L 11/21/23  Ankle  dorsiflexion 12 1 10   Ankle plantarflexion 71 49 50  Ankle inversion 40 27 38  Ankle eversion 25 12 28    Passive ROM Left eval  Ankle dorsiflexion 6  Ankle plantarflexion  50  Ankle inversion 34  Ankle eversion 14  (Blank rows = not tested)  LOWER EXTREMITY MMT:  MMT Right eval Left eval  Hip flexion 5 4-  Hip extension 4+ 3+  Hip abduction 4+ 4-  Hip adduction 4+ 4+  Hip internal rotation 4+ 4-  Hip external rotation 4- 3+  Knee flexion 5 5  Knee extension 5 4+  Ankle dorsiflexion 5 3+  Ankle plantarflexion 4- (8 SLS HR) 2  Ankle inversion 5 3  Ankle eversion 5 3-   (Blank rows = not tested)  FUNCTIONAL TESTS:  5 times sit to stand: 20.25 sec  SLS: R = 37.66 sec, L = 10.78 sec  GAIT: Distance walked: Clinic distances Assistive device utilized: None Level of assistance: Complete Independence Gait pattern: decreased step length- Right, decreased stance time- Left, decreased ankle dorsiflexion- Left, antalgic, and lateral lean- Right   TODAY'S TREATMENT:  11/21/23 THERAPEUTIC EXERCISE: To improve strength and endurance.  Demonstration, verbal and tactile cues throughout for technique.  NuStep - L6 x 6 min (UE/LE) Seated self-anchored RTB ankle eversion 2 x 10 Seated self-anchored RTB ankle DF 2 x 10 Seated self-anchored RTB ankle inversion 2 x 10 Clamshell RTB x 20 R/L in sidelying  NEUROMUSCULAR RE-EDUCATION: To improve balance, coordination, kinesthesia, proprioception, and reduce fall risk. Standing hip abduction RTB at ankles x 10 B Standing hip extension RTB at ankles x 10 B Standing hip ABD to ext toe taps RTB at ankles x 10 B Sidestep 2x along counter RTB at ankles  11/18/23 THERAPEUTIC EXERCISE: To improve strength and endurance.  Demonstration, verbal and tactile cues throughout for technique.  NuStep - L6 x 6 min (UE/LE) Seated self-anchored YTB ankle eversion 2 x 10 Seated self-anchored YTB ankle DF 2 x 10 Seated self-anchored YTB ankle inversion 2 x  10 Seated self-anchored GTB ankle PF 2 x 10  NEUROMUSCULAR RE-EDUCATION: To improve balance, coordination, kinesthesia, proprioception, and reduce fall risk. Seated BAPS - L3 DF/PF 2 x 10 (keeping sides level) 2.5 weights on lateral, then medial side x 10 each IV/EV 2 x 10 - more difficult CW/CCW x 20 SLS 2 x 30 L - no UE support Standing L DF/knee flexion mobilization on slant board 5x3  11/13/23 THERAPEUTIC EXERCISE: To improve strength and endurance.  Demonstration, verbal and tactile cues throughout for technique.  NuStep - L6 x 6 min (UE/LE) Seated self-anchored YTB ankle eversion 2 x 10 Seated self-anchored YTB ankle DF 2 x 10 Seated self-anchored YTB ankle inversion 2 x 10 Seated self-anchored GTB ankle PF 2 x 10  MANUAL THERAPY: To promote improved flexibility, improved joint mobility, increased ROM, and reduced pain utilizing joint mobilization.  L ankle subtalar and tarsal-metatarsal joint mobs - grade II-III  NEUROMUSCULAR RE-EDUCATION: To improve balance, coordination, kinesthesia, proprioception, and reduce fall risk. Seated BAPS - L3 DF/PF 2 x 10 (keeping sides level) IV/EV 2 x 10 (keeping front/back level) - more difficult CW/CCW x 10 SLS 2 x 30 L - intermittent light support from single pole   11/11/23  THERAPEUTIC EXERCISE: To improve strength, endurance, ROM, and flexibility.  Demonstration, verbal and tactile cues throughout for technique.  S/L clams w/ YTB at distal thighs 2x10 B  Seated Resisted DF AROM w/ YTB 2x10 B  Seated Resisted PF AROM w/ YTB 2x10 B  Seated Resisted ankle eversion w/ YTB 2x10 B   NEURO RE-ED (corner balance) Static Stance x30  Static Stance with eyes closed x30- L ankle feels less  steady  Staggered stance x30 B- more difficult w/LLE back  Tandem Stance x30 B- harder with LLE back  SLS x30 B- much more difficult on LLE, pain in front and lateral ankle   SELF CARE: Provided education for HEP review including recommended  frequency for home performance.  Reviewed HEP w/ patient  Gastroc Stretch 2x30  Soleus Stretch 2x30  Ankle circles x10 ea direction Heel/Toe Raises x20    10/27/2023  SELF CARE:  Reviewed eval findings and role of PT in addressing identified deficits as well as instruction in initial HEP (see below).    PATIENT EDUCATION:  Education details: HEP review and HEP modification - self-anchored options for ankle TB exercises  Person educated: Patient Education method: Explanation, Demonstration, Verbal cues, and MedBridgeGO app updated Education comprehension: verbalized understanding, returned demonstration, verbal cues required, and needs further education  HOME EXERCISE PROGRAM: Access Code: [email or web address] URL: [email or web address] Date: 11/21/2023 Prepared by: Mackinley Cassaday  Exercises - Gastroc Stretch on Wall (Mirrored)  - 2-3 x daily - 7 x weekly - 3 reps - 30 sec hold - Standing Soleus Stretch  - 2-3 x daily - 7 x weekly - 3 reps - 30 sec hold - Ankle Circles in Elevation  - 2-3 x daily - 7 x weekly - 2 sets - 10 reps - Seated Heel Toe Raises  - 2-3 x daily - 7 x weekly - 2 sets - 10 reps - 3 sec hold - Clamshell with Resistance  - 1 x daily - 7 x weekly - 2 sets - 10 reps - Long Sitting Ankle Plantar Flexion with Resistance  - 1 x daily - 7 x weekly - 2 sets - 10 reps - 3 sec hold - Seated Ankle Dorsiflexion with Resistance  - 1 x daily - 7 x weekly - 2 sets - 10 reps - 3 sec hold - Seated Ankle Eversion with Resistance  - 1 x daily - 7 x weekly - 2 sets - 10 reps - 3 sec hold - Seated Ankle Inversion with Resistance  - 1 x daily - 7 x weekly - 2 sets - 10 reps - 3 sec hold - Hip Abduction with Resistance Loop  - 1 x daily - 7 x weekly - 2 sets - 10 reps - Hip Extension with Resistance Loop  - 1 x daily - 7 x weekly - 2 sets - 10 reps   ASSESSMENT:  CLINICAL IMPRESSION: Progressed with intrinsic ankle strengthening, proximal LE strengthening and proprioceptive  training. Able to progress HEP today for ankle and proximal LE strengthening. Cues required to have good form with clamshells. Showed her how to use Medbridge app today as well for HEP. ROM is improving. Dana [name] will benefit from continued skilled PT to address ongoing ROM, strength, proprioception and balance deficits to improve mobility and activity tolerance with decreased pain interference.   Dana [name] reports that she has noticed improvement in scar tissue and overall ankle mobility with walking. Her HEP is going well and she has been consistent even when her ankle stiffens up at times still. During today's session, pt was able to move the left ankle better during all activities reporting that she has noticed improved ROM already with her current HEP. Began gentle LE strengthening exercises with focus on ankle strengthening. Pt was introduced to light resistance and stated that with eversion she feels it is more challenging to move her ankle as she needed to use more knee and hip to complete the motion. Pt's  incisions are healing well and swelling has decreased. Pt was also introduced to corner balance exercises and reported difficulty with L SLS, staggered stance, and tandem stance when the LLE needed to hold and maintain more of her weight. She reported some pain/discomfort with L SLS in particular. Pt is aware of new additions to HEP and expressed she is looking forward to therapy as she already sees improvement. Dana [name] will continue to benefit from skilled PT interventions addressing deficits to improve mobility and activity tolerance w/ dec'd pain interference.     EVAL: Dana [name] is a 28 y.o. female who was referred to physical therapy for evaluation and treatment s/p correction hallux valgus left foot with metatarsus adductus correction and Akin osteotomy on 07/11/23.  Patient has been released from any postop restrictions and cleared to resume normal activity including running as tolerated.    Patient reports L foot ankle and calf pain persist since surgery.  Pain is worse with prolonged sitting or standing, or walking long distances.  Patient has deficits in L foot/ankle ROM, distal L LE flexibility, L>R LE strength, abnormal posture, and TTP with abnormal muscle tension which are interfering with ADLs and are impacting quality of life.  On LEFS patient scored 32/80 demonstrating moderate functional limitation.  Dana [name] will benefit from skilled PT to address above deficits to improve mobility and activity tolerance with decreased pain interference prior to proceeding with surgery for R foot.  OBJECTIVE IMPAIRMENTS: Abnormal gait, decreased activity tolerance, decreased balance, decreased endurance, decreased knowledge of condition, decreased mobility, difficulty walking, decreased ROM, decreased strength, hypomobility, increased edema, increased fascial restrictions, impaired perceived functional ability, increased muscle spasms, impaired flexibility, impaired sensation, improper body mechanics, postural dysfunction, pain, and hypermobility.   ACTIVITY LIMITATIONS: carrying, lifting, bending, sitting, standing, squatting, sleeping, stairs, transfers, bathing, locomotion level, and caring for others  PARTICIPATION LIMITATIONS: meal prep, cleaning, laundry, shopping, community activity, occupation, and yard work  PERSONAL FACTORS: Past/current experiences, Profession, Time since onset of injury/illness/exacerbation, and 3+ comorbidities: anxiety, depression, asthma, Ehlers-Danlos/hypermobility syndrome, IBS are also affecting patient's functional outcome.   REHAB POTENTIAL: Good  CLINICAL DECISION MAKING: Evolving/moderate complexity  EVALUATION COMPLEXITY: Moderate   GOALS: Goals reviewed with patient? Yes  SHORT TERM GOALS: Target date: 12/01/2023  Patient will be independent with initial HEP. Baseline: Initial HEP provided on eval Goal status: MET - 11/11/23 - pt is doing HEP  consistently   2.  Patient will report at least 25% improvement in L foot/ankle/calf pain to improve QOL. Baseline: 2/10 on eval, up to 8/10 Goal status: MET- 11/18/23  3.  Patient will improve 5x STS time to </= 15 seconds to demonstrate improved functional strength and transfer efficiency.  Baseline: 20.25 sec Goal status: INITIAL   LONG TERM GOALS: Target date: 01/05/2024  Patient will be independent with advanced/ongoing HEP to improve outcomes and carryover.  Baseline:  Goal status: INITIAL  2.  Patient will report at least 50-75% improvement in L foot/ankle/calf pain to improve QOL. Baseline: 2/10 on eval, up to 8/10 Goal status: INITIAL  3.  Patient will demonstrate improved L ankle AROM to [facility] to allow for normal gait and stair mechanics. Baseline: Refer to above LE ROM table Goal status: INITIAL  4.  Patient will demonstrate improved B LE strength to >/= 4+/5 with exception of L PF to >/= 3+/5 for improved stability and ease of mobility. Baseline: Refer to above LE MMT table Goal status: INITIAL  5.  Patient will be able to ambulate ~  1 mile with normal gait pattern without increased foot/ankle pain to access community.  Baseline: Antalgic gait pattern with gait distance limited to only a few blocks Goal status: INITIAL  6. Patient will be able to ascend/descend stairs with 1 HR and reciprocal step pattern safely to access home and community.  Baseline:  Goal status: INITIAL  7.  Patient will report >/= 45/80 on LEFS to demonstrate improved functional ability. Baseline: 32 / 80 = 40.0 % Goal status: INITIAL  8.  Patient will improve L SLS to >/= 20 sec to demonstrate improved L ankle stability. Baseline: 10.78 sec Goal status: IN PROGRESS - 11/13/23 - L SLS with 2 x 30 with intermittent light 1 pole    PLAN:  PT FREQUENCY: 2x/week  PT DURATION: 8-10 weeks   PLANNED INTERVENTIONS: 02835- PT Re-evaluation, 97750- Physical Performance Testing, 97110-Therapeutic  exercises, 97530- Therapeutic activity, 97112- Neuromuscular re-education, 97535- Self Care, 02859- Manual therapy, [phone]- Gait training, [phone]- Aquatic Therapy, [phone]- Electrical stimulation (unattended), 97016- Vasopneumatic device, [id number]- Ultrasound, [id number]- Ionotophoresis 4mg /ml Dexamethasone , 79439 (1-2 muscles), 20561 (3+ muscles)- Dry Needling, Patient/Family education, Balance training, Stair training, Taping, Joint mobilization, Scar mobilization, DME instructions, Cryotherapy, and Moist heat  PLAN FOR NEXT SESSION: MT including STM/joint mobilizations PRN to reduce edema and improve L ankle ROM; L ankle P/AROM; gently progress L ankle and proximal LE strengthening - update HEP accordingly; continue proprioceptive and balance training   Sol LITTIE [name], PTA 11/21/2023, 8:47 AM

## 2023-11-25 ENCOUNTER — Ambulatory Visit: Attending: Podiatry | Admitting: Physical Therapy

## 2023-11-25 ENCOUNTER — Encounter: Payer: Self-pay | Admitting: Physical Therapy

## 2023-11-25 DIAGNOSIS — F411 Generalized anxiety disorder: Secondary | ICD-10-CM | POA: Diagnosis not present

## 2023-11-25 DIAGNOSIS — M6281 Muscle weakness (generalized): Secondary | ICD-10-CM | POA: Diagnosis not present

## 2023-11-25 DIAGNOSIS — R600 Localized edema: Secondary | ICD-10-CM | POA: Insufficient documentation

## 2023-11-25 DIAGNOSIS — M25572 Pain in left ankle and joints of left foot: Secondary | ICD-10-CM | POA: Diagnosis not present

## 2023-11-25 DIAGNOSIS — R2689 Other abnormalities of gait and mobility: Secondary | ICD-10-CM | POA: Insufficient documentation

## 2023-11-25 NOTE — Therapy (Signed)
 OUTPATIENT PHYSICAL THERAPY LOWER EXTREMITY TREATMENT   Patient Name: Dana [name] MRN: [id number] DOB:Apr 26, 1995, 28 y.o., female Today's Date: 11/25/2023   END OF SESSION:  PT End of Session - 11/25/23 0808     Visit Number 6    Date for PT Re-Evaluation 01/05/24    Authorization Type [employer] & [facility] Medicaid    PT Start Time [phone]   Pt arrived late   PT Stop Time 0847    PT Time Calculation (min) 39 min    Activity Tolerance Patient tolerated treatment well    Behavior During Therapy [facility] for tasks assessed/performed            Past Medical History:  Diagnosis Date   Anxiety    Anxiety disorder of adolescence 06/09/2015   Arm fracture    Asthma    Constipation    alternates with diarrhea   Depression    Diarrhea    alternates with constipation   Ehlers-Danlos syndrome    per patient   GERD (gastroesophageal reflux disease)    Inflammatory bowel disease    Insomnia 06/09/2015   Past Surgical History:  Procedure Laterality Date   FOOT ARTHRODESIS Left 07/11/2023   Procedure: FUSION, JOINT, FOOT;  Surgeon: Silva Juliene SAUNDERS, DPM;  Location: [facility];  Service: Podiatry;  Laterality: Left;  MID FOOT FUSION   HALLUX VALGUS AKIN Left 07/11/2023   Procedure: CORRECTION, HALLUX VALGUS, WITH AKIN OSTEOTOMY;  Surgeon: Silva Juliene SAUNDERS, DPM;  Location: [facility];  Service: Podiatry;  Laterality: Left;  GENERAL WITH BLOCK   HALLUX VALGUS LAPIDUS Left 07/11/2023   Procedure: ROMAYNE LOOK;  Surgeon: Silva Juliene SAUNDERS, DPM;  Location: [facility];  Service: Podiatry;  Laterality: Left;   NO PAST SURGERIES     Patient Active Problem List   Diagnosis Date Noted   Metatarsus primus varus of left foot 07/13/2023   Acquired hallux valgus of left foot 07/13/2023   Metatarsus adductus of left foot 07/13/2023   Major osseous defect 07/13/2023   Otalgia of both ears 03/06/2023   Body aches 03/06/2023   Hypermobility syndrome 02/18/2023   Suicidal ideation 12/19/2015   MDD  (major depressive disorder), recurrent episode, severe (HCC) 12/18/2015   Insomnia 06/09/2015   Anxiety disorder of adolescence 06/09/2015   Severe episode of recurrent major depressive disorder, without psychotic features (HCC) 06/02/2015   Alternating constipation and diarrhea    Nausea & vomiting 09/29/2011   IBS (irritable bowel syndrome) 06/28/2011   GERD (gastroesophageal reflux disease) 06/28/2011   Back pain 08/01/2010   DYSURIA 08/17/2009   ACUTE PHARYNGITIS 04/06/2009   ACUTE BRONCHITIS 04/06/2009   Acute gastritis 04/27/2007   Anxiety state 02/11/2007   Allergic rhinitis 02/11/2007   GERD 12/25/2006   LOOSE STOOLS 12/25/2006   RUQ PAIN 12/25/2006    PCP: Antonio Cyndee Jamee SAUNDERS, DO   REFERRING PROVIDER: Silva Juliene SAUNDERS, DPM   REFERRING DIAG:  M20.12,M21.612 (ICD-10-CM) - Hallux valgus with bunions, left  Q66.229 (ICD-10-CM) - Metatarsus adductus, congenital   Postop bunionectomy 3 months ago difficulty ambulating and swelling in ankle, work on joint manipulation range of motion strengthening and gait training. Evaluate and treat modalities as needed at your discretion twice weekly for 6 to 8 weeks.   THERAPY DIAG:  Pain in left ankle and joints of left foot  Muscle weakness (generalized)  Other abnormalities of gait and mobility  Localized edema  RATIONALE FOR EVALUATION AND TREATMENT: Rehabilitation  ONSET DATE: 07/11/2023 Surgery/procedure:  LOOK Bunionectomy Correction  metatarsus adductus with 2nd and 3rd TMT fusion with osteotomy Akin phalangeal osteotomy  NEXT MD VISIT: 11/20/23   SUBJECTIVE:                                                                                                                                                                                                         SUBJECTIVE STATEMENT: Pt reports just a little soreness this morning.  Still has difficulty wearing sneakers due to hypersensitivity along dorsal  incisions.  PAIN: Are you having pain? No and Yes: NPRS scale: 3/10  Pain location: L ankle, but sometimes also in calf and foot  Pain description: sharp, burning  Aggravating factors: prolonged sitting or standing, walking more than a couple blocks  Relieving factors: elevation, rest, ice   PERTINENT HISTORY:  anxiety, depression, asthma, Ehlers-Danlos/hypermobility syndrome, IBS  PRECAUTIONS: None - No restrictions on activity. Regular shoe gear as tolerated.   RED FLAGS: None  WEIGHT BEARING RESTRICTIONS: No  FALLS:  Has patient fallen in last 6 months? Yes. 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   OBJECTIVE: (objective measures completed at initial evaluation unless otherwise dated)  DIAGNOSTIC FINDINGS:  Not available  PATIENT SURVEYS:  LEFS: 32 / 80 = 40.0 %, moderate functional limitation  COGNITION: Overall cognitive status: Within functional limits for tasks assessed    SENSATION: Continued numbness and tingling in L foot, ankle and calf since surgery (slightly better in calf)  EDEMA:  Figure 8: R = 48.0 cm, L = 50.0 cm    POSTURE:  B pes planus in standing  PALPATION: Decreased incisional scar mobility, most notable on medial incision Decreased mid and hindfoot mobility Increased muscle tension/tightness in L gastroc/soleus, fibularis muscles, and anterior tibialis  MUSCLE LENGTH:  Heelcord: Mild tight L>R  LOWER EXTREMITY ROM:  Active ROM Right eval Left eval L 11/21/23  Ankle dorsiflexion  12 1 10   Ankle plantarflexion 71 49 50  Ankle inversion 40 27 38  Ankle eversion 25 12 28    Passive ROM Left eval  Ankle dorsiflexion 6  Ankle plantarflexion 50  Ankle inversion 34  Ankle eversion  14  (Blank rows = not tested)  LOWER EXTREMITY MMT:  MMT Right eval Left eval  Hip flexion 5 4-  Hip extension 4+ 3+  Hip abduction 4+ 4-  Hip adduction 4+ 4+  Hip internal rotation 4+ 4-  Hip external rotation 4- 3+  Knee flexion 5 5  Knee extension 5 4+  Ankle dorsiflexion 5 3+  Ankle plantarflexion 4- (8 SLS HR) 2  Ankle inversion 5 3  Ankle eversion 5 3-   (Blank rows = not tested)  FUNCTIONAL TESTS:  5 times sit to stand: 20.25 sec  SLS: R = 37.66 sec, L = 10.78 sec  GAIT: Distance walked: Clinic distances Assistive device utilized: None Level of assistance: Complete Independence Gait pattern: decreased step length- Right, decreased stance time- Left, decreased ankle dorsiflexion- Left, antalgic, and lateral lean- Right   TODAY'S TREATMENT:   11/25/23 THERAPEUTIC EXERCISE: To improve strength, endurance, ROM, and flexibility.  Demonstration, verbal and tactile cues throughout for technique.  NuStep - L6 x 6 min (UE/LE) Seated self-anchored RTB ankle eversion 2 x 10 Seated self-anchored RTB ankle DF 2 x 10 Seated self-anchored RTB ankle inversion 2 x 10 Seated self-anchored blue TB ankle PF 2 x 10 L ankle RTB toe curls 2 x 10  SELF CARE:  Provided education on post-op incisional scar tissue mobilization and desensitization techniques rubbing with different textures .   THERAPEUTIC ACTIVITIES: To improve functional performance.  Demonstration, verbal and tactile cues throughout for technique. 5xSTS = 13.25 sec  NEUROMUSCULAR RE-EDUCATION: To improve coordination, kinesthesia, proprioception, amplitude of movement, and reduce rigidity. L ankle PNF D1 & D2 diagonals 3 x 10 - 1 set w/o resistance, 2nd set resisting DF diagonals, 3rd set resisting PF diagonals Bridge  + RTB B hip ABD/ER clam (maintaining level pelvis) 2 x 10 Bridge + RTB march x 10   11/21/23 THERAPEUTIC EXERCISE: To improve strength and endurance.  Demonstration, verbal and tactile cues throughout for technique.  NuStep - L6 x 6 min (UE/LE) Seated self-anchored RTB ankle eversion 2 x 10 Seated self-anchored RTB ankle DF 2 x 10 Seated self-anchored RTB ankle inversion 2 x 10 Clamshell RTB x 20 R/L in sidelying  NEUROMUSCULAR RE-EDUCATION: To improve balance, coordination, kinesthesia, proprioception, and reduce fall risk. Standing hip abduction RTB at ankles x 10 B Standing hip extension RTB at ankles x 10 B Standing hip ABD to ext toe taps RTB at ankles x 10 B Sidestep 2x along counter RTB at ankles   11/18/23 THERAPEUTIC EXERCISE: To improve strength and endurance.  Demonstration, verbal and tactile cues throughout for technique.  NuStep - L6 x 6 min (UE/LE) Seated self-anchored YTB ankle eversion 2 x 10 Seated self-anchored YTB ankle DF 2 x 10 Seated self-anchored YTB ankle inversion 2 x 10 Seated self-anchored GTB ankle PF 2 x 10  NEUROMUSCULAR RE-EDUCATION: To improve balance, coordination, kinesthesia, proprioception, and reduce fall risk. Seated BAPS - L3 DF/PF 2 x 10 (keeping sides level) 2.5 weights on lateral, then medial side x 10 each IV/EV 2 x 10 - more difficult CW/CCW x 20 SLS 2 x 30 L - no UE support Standing L DF/knee flexion mobilization on slant board 5x3   11/13/23 THERAPEUTIC EXERCISE: To improve strength and endurance.  Demonstration, verbal and tactile cues throughout for technique.  NuStep - L6 x 6 min (UE/LE) Seated self-anchored YTB ankle eversion 2 x 10 Seated self-anchored YTB ankle DF 2 x 10 Seated self-anchored YTB ankle inversion 2 x 10 Seated  self-anchored GTB ankle PF 2 x 10  MANUAL THERAPY: To promote improved flexibility, improved joint mobility, increased ROM, and reduced pain utilizing joint mobilization.  L ankle subtalar and  tarsal-metatarsal joint mobs - grade II-III  NEUROMUSCULAR RE-EDUCATION: To improve balance, coordination, kinesthesia, proprioception, and reduce fall risk. Seated BAPS - L3 DF/PF 2 x 10 (keeping sides level) IV/EV 2 x 10 (keeping front/back level) - more difficult CW/CCW x 10 SLS 2 x 30 L - intermittent light support from single pole   11/11/23  THERAPEUTIC EXERCISE: To improve strength, endurance, ROM, and flexibility.  Demonstration, verbal and tactile cues throughout for technique.  S/L clams w/ YTB at distal thighs 2x10 B  Seated Resisted DF AROM w/ YTB 2x10 B  Seated Resisted PF AROM w/ YTB 2x10 B  Seated Resisted ankle eversion w/ YTB 2x10 B   NEURO RE-ED (corner balance) Static Stance x30  Static Stance with eyes closed x30- L ankle feels less steady  Staggered stance x30 B- more difficult w/LLE back  Tandem Stance x30 B- harder with LLE back  SLS x30 B- much more difficult on LLE, pain in front and lateral ankle   SELF CARE: Provided education for HEP review including recommended frequency for home performance.  Reviewed HEP w/ patient  Gastroc Stretch 2x30  Soleus Stretch 2x30  Ankle circles x10 ea direction Heel/Toe Raises x20    10/27/2023  SELF CARE:  Reviewed eval findings and role of PT in addressing identified deficits as well as instruction in initial HEP (see below).    PATIENT EDUCATION:  Education details: HEP review and desensitization techniques to reduce hypersensitivity  Person educated: Patient Education method: Explanation, Demonstration, Verbal cues, Handouts, and MedBridge[employer] app updated Education comprehension: verbalized understanding, returned demonstration, verbal cues required, and needs further education  HOME EXERCISE PROGRAM: *Pt using MedBridge[employer] app.  Access Code: [email or web address] URL: [email or web address] Date: 11/25/2023 Prepared by: Elijah Hidden  Exercises - Gastroc Stretch on Wall (Mirrored)  - 2-3 x daily - 7 x  weekly - 3 reps - 30 sec hold - Standing Soleus Stretch  - 2-3 x daily - 7 x weekly - 3 reps - 30 sec hold - Ankle Circles in Elevation  - 2-3 x daily - 7 x weekly - 2 sets - 10 reps - Seated Heel Toe Raises  - 2-3 x daily - 7 x weekly - 2 sets - 10 reps - 3 sec hold - Clamshell with Resistance  - 1 x daily - 7 x weekly - 2 sets - 10 reps - Long Sitting Ankle Plantar Flexion with Resistance  - 1 x daily - 7 x weekly - 2 sets - 10 reps - 3 sec hold - Seated Ankle Dorsiflexion with Resistance  - 1 x daily - 7 x weekly - 2 sets - 10 reps - 3 sec hold - Seated Ankle Eversion with Resistance  - 1 x daily - 7 x weekly - 2 sets - 10 reps - 3 sec hold - Seated Ankle Inversion with Resistance  - 1 x daily - 7 x weekly - 2 sets - 10 reps - 3 sec hold - Hip Abduction with Resistance Loop  - 1 x daily - 7 x weekly - 2 sets - 10 reps - Hip Extension with Resistance Loop  - 1 x daily - 7 x weekly - 2 sets - 10 reps  Patient Education - Rubbing with Different Textures   ASSESSMENT:  CLINICAL IMPRESSION: Alyra reports some mild increased soreness  from increased activity recently.  She notes she is still wearing sandals/flip-flops due to hypersensitivity along her dorsal foot surgical incisions, therefore reviewed scar tissue massage and provided education on desensitization techniques to help reduce hypersensitivity to allow her to wear more supportive shoes.  Progressed foot and ankle strengthening and proprioceptive training introducing PNF diagonals as well as core/proximal LE strengthening for improved stability.  All exercises well tolerated except slight increased discomfort with D2 pattern during diagonals.  Deferred further HEP update today as HEP just updated last session. Functionally she is improving with mobility with 5xSTS reduced by 7 sec.  All STGs now met.  Addalynn will benefit from continued skilled PT to address ongoing ROM, strength, proprioception and balance deficits to improve mobility and  activity tolerance with decreased pain interference.   EVAL: Amesha L Dougan is a 28 y.o. female who was referred to physical therapy for evaluation and treatment s/p correction hallux valgus left foot with metatarsus adductus correction and Akin osteotomy on 07/11/23.  Patient has been released from any postop restrictions and cleared to resume normal activity including running as tolerated.   Patient reports L foot ankle and calf pain persist since surgery.  Pain is worse with prolonged sitting or standing, or walking long distances.  Patient has deficits in L foot/ankle ROM, distal L LE flexibility, L>R LE strength, abnormal posture, and TTP with abnormal muscle tension which are interfering with ADLs and are impacting quality of life.  On LEFS patient scored 32/80 demonstrating moderate functional limitation.  Semone will benefit from skilled PT to address above deficits to improve mobility and activity tolerance with decreased pain interference prior to proceeding with surgery for R foot.  OBJECTIVE IMPAIRMENTS: Abnormal gait, decreased activity tolerance, decreased balance, decreased endurance, decreased knowledge of condition, decreased mobility, difficulty walking, decreased ROM, decreased strength, hypomobility, increased edema, increased fascial restrictions, impaired perceived functional ability, increased muscle spasms, impaired flexibility, impaired sensation, improper body mechanics, postural dysfunction, pain, and hypermobility.   ACTIVITY LIMITATIONS: carrying, lifting, bending, sitting, standing, squatting, sleeping, stairs, transfers, bathing, locomotion level, and caring for others  PARTICIPATION LIMITATIONS: meal prep, cleaning, laundry, shopping, community activity, occupation, and yard work  PERSONAL FACTORS: Past/current experiences, Profession, Time since onset of injury/illness/exacerbation, and 3+ comorbidities: anxiety, depression, asthma, Ehlers-Danlos/hypermobility syndrome,  IBS are also affecting patient's functional outcome.   REHAB POTENTIAL: Good  CLINICAL DECISION MAKING: Evolving/moderate complexity  EVALUATION COMPLEXITY: Moderate   GOALS: Goals reviewed with patient? Yes  SHORT TERM GOALS: Target date: 12/01/2023  Patient will be independent with initial HEP. Baseline: Initial HEP provided on eval Goal status: MET - 11/11/23 - pt is doing HEP consistently   2.  Patient will report at least 25% improvement in L foot/ankle/calf pain to improve QOL. Baseline: 2/10 on eval, up to 8/10 Goal status: MET - 11/18/23  3.  Patient will improve 5x STS time to </= 15 seconds to demonstrate improved functional strength and transfer efficiency.  Baseline: 20.25 sec Goal status: MET - 11/25/23 - 13.25 sec   LONG TERM GOALS: Target date: 01/05/2024  Patient will be independent with advanced/ongoing HEP to improve outcomes and carryover.  Baseline:  Goal status: INITIAL  2.  Patient will report at least 50-75% improvement in L foot/ankle/calf pain to improve QOL. Baseline: 2/10 on eval, up to 8/10 Goal status: INITIAL  3.  Patient will demonstrate improved L ankle AROM to [facility] to allow for normal gait and stair mechanics. Baseline: Refer to above LE ROM table Goal  status: INITIAL  4.  Patient will demonstrate improved B LE strength to >/= 4+/5 with exception of L PF to >/= 3+/5 for improved stability and ease of mobility. Baseline: Refer to above LE MMT table Goal status: INITIAL  5.  Patient will be able to ambulate ~1 mile with normal gait pattern without increased foot/ankle pain to access community.  Baseline: Antalgic gait pattern with gait distance limited to only a few blocks Goal status: INITIAL  6. Patient will be able to ascend/descend stairs with 1 HR and reciprocal step pattern safely to access home and community.  Baseline:  Goal status: INITIAL  7.  Patient will report >/= 45/80 on LEFS to demonstrate improved functional  ability. Baseline: 32 / 80 = 40.0 % Goal status: INITIAL  8.  Patient will improve L SLS to >/= 20 sec to demonstrate improved L ankle stability. Baseline: 10.78 sec Goal status: IN PROGRESS - 11/13/23 - L SLS with 2 x 30 with intermittent light 1 pole    PLAN:  PT FREQUENCY: 2x/week  PT DURATION: 8-10 weeks   PLANNED INTERVENTIONS: 02835- PT Re-evaluation, 97750- Physical Performance Testing, 97110-Therapeutic exercises, 97530- Therapeutic activity, 97112- Neuromuscular re-education, 97535- Self Care, 02859- Manual therapy, [phone]- Gait training, [phone]- Aquatic Therapy, [phone]- Electrical stimulation (unattended), 97016- Vasopneumatic device, [id number]- Ultrasound, [id number]- Ionotophoresis 4mg /ml Dexamethasone , 79439 (1-2 muscles), 20561 (3+ muscles)- Dry Needling, Patient/Family education, Balance training, Stair training, Taping, Joint mobilization, Scar mobilization, DME instructions, Cryotherapy, and Moist heat  PLAN FOR NEXT SESSION: MT including STM/joint mobilizations PRN to reduce edema and improve L ankle ROM; L ankle P/AROM; gently progress L ankle and proximal LE strengthening - update HEP accordingly; continue proprioceptive and balance training   Elijah CHRISTELLA Hidden, PT 11/25/2023, 9:23 AM

## 2023-11-27 ENCOUNTER — Encounter: Payer: Self-pay | Admitting: Physical Therapy

## 2023-11-27 ENCOUNTER — Ambulatory Visit

## 2023-12-02 ENCOUNTER — Encounter: Payer: Self-pay | Admitting: Family Medicine

## 2023-12-02 ENCOUNTER — Ambulatory Visit

## 2023-12-02 DIAGNOSIS — M6281 Muscle weakness (generalized): Secondary | ICD-10-CM

## 2023-12-02 DIAGNOSIS — R2689 Other abnormalities of gait and mobility: Secondary | ICD-10-CM | POA: Diagnosis not present

## 2023-12-02 DIAGNOSIS — F411 Generalized anxiety disorder: Secondary | ICD-10-CM | POA: Diagnosis not present

## 2023-12-02 DIAGNOSIS — R600 Localized edema: Secondary | ICD-10-CM | POA: Diagnosis not present

## 2023-12-02 DIAGNOSIS — M25572 Pain in left ankle and joints of left foot: Secondary | ICD-10-CM

## 2023-12-02 NOTE — Therapy (Signed)
 OUTPATIENT PHYSICAL THERAPY LOWER EXTREMITY TREATMENT   Patient Name: Dana [name] MRN: [id number] DOB:October 26, 1995, 28 y.o., female Today's Date: 12/02/2023   END OF SESSION:  PT End of Session - 12/02/23 0815     Visit Number 7    Date for PT Re-Evaluation 01/05/24    Authorization Type BCBS & [facility] Medicaid    PT Start Time 0809    PT Stop Time 0852    PT Time Calculation (min) 43 min    Activity Tolerance Patient tolerated treatment well    Behavior During Therapy [facility] for tasks assessed/performed            Past Medical History:  Diagnosis Date   Anxiety    Anxiety disorder of adolescence 06/09/2015   Arm fracture    Asthma    Constipation    alternates with diarrhea   Depression    Diarrhea    alternates with constipation   Ehlers-Danlos syndrome    per patient   GERD (gastroesophageal reflux disease)    Inflammatory bowel disease    Insomnia 06/09/2015   Past Surgical History:  Procedure Laterality Date   FOOT ARTHRODESIS Left 07/11/2023   Procedure: FUSION, JOINT, FOOT;  Surgeon: Silva Juliene SAUNDERS, DPM;  Location: [facility] ORS;  Service: Podiatry;  Laterality: Left;  MID FOOT FUSION   HALLUX VALGUS AKIN Left 07/11/2023   Procedure: CORRECTION, HALLUX VALGUS, WITH AKIN OSTEOTOMY;  Surgeon: Silva Juliene SAUNDERS, DPM;  Location: [facility] OR[facility];  Service: Podiatry;  Laterality: Left;  GENERAL WITH BLOCK   HALLUX VALGUS LAPIDUS Left 07/11/2023   Procedure: ROMAYNE LOOK;  Surgeon: Silva Juliene SAUNDERS, DPM;  Location: [facility] OR[facility];  Service: Podiatry;  Laterality: Left;   NO PAST SURGERIES     Patient Active Problem List   Diagnosis Date Noted   Metatarsus primus varus of left foot 07/13/2023   Acquired hallux valgus of left foot 07/13/2023   Metatarsus adductus of left foot 07/13/2023   Major osseous defect 07/13/2023   Otalgia of both ears 03/06/2023   Body aches 03/06/2023   Hypermobility syndrome 02/18/2023   Suicidal ideation 12/19/2015   MDD (major depressive  disorder), recurrent episode, severe (HCC) 12/18/2015   Insomnia 06/09/2015   Anxiety disorder of adolescence 06/09/2015   Severe episode of recurrent major depressive disorder, without psychotic features (HCC) 06/02/2015   Alternating constipation and diarrhea    Nausea & vomiting 09/29/2011   IBS (irritable bowel syndrome) 06/28/2011   GERD (gastroesophageal reflux disease) 06/28/2011   Back pain 08/01/2010   DYSURIA 08/17/2009   ACUTE PHARYNGITIS 04/06/2009   ACUTE BRONCHITIS 04/06/2009   Acute gastritis 04/27/2007   Anxiety state 02/11/2007   Allergic rhinitis 02/11/2007   GERD 12/25/2006   LOOSE STOOLS 12/25/2006   RUQ PAIN 12/25/2006    PCP: Antonio Cyndee Jamee SAUNDERS, DO   REFERRING PROVIDER: Silva Juliene SAUNDERS, DPM   REFERRING DIAG:  M20.12,M21.612 (ICD-10-CM) - Hallux valgus with bunions, left  Q66.229 (ICD-10-CM) - Metatarsus adductus, congenital   Postop bunionectomy 3 months ago difficulty ambulating and swelling in ankle, work on joint manipulation range of motion strengthening and gait training. Evaluate and treat modalities as needed at your discretion twice weekly for 6 to 8 weeks.   THERAPY DIAG:  Pain in left ankle and joints of left foot  Muscle weakness (generalized)  Other abnormalities of gait and mobility  Localized edema  RATIONALE FOR EVALUATION AND TREATMENT: Rehabilitation  ONSET DATE: 07/11/2023 Surgery/procedure:  LOOK Bunionectomy Correction metatarsus adductus with 2nd  and 3rd TMT fusion with osteotomy Akin phalangeal osteotomy  NEXT MD VISIT: 11/20/23   SUBJECTIVE:                                                                                                                                                                                                         SUBJECTIVE STATEMENT: L ankle feels ok  PAIN: Are you having pain? No and Yes: NPRS scale: 3/10  Pain location: L ankle, but sometimes also in calf and foot  Pain description:  sharp, burning  Aggravating factors: prolonged sitting or standing, walking more than a couple blocks  Relieving factors: elevation, rest, ice   PERTINENT HISTORY:  anxiety, depression, asthma, Ehlers-Danlos/hypermobility syndrome, IBS  PRECAUTIONS: None - No restrictions on activity. Regular shoe gear as tolerated.   RED FLAGS: None  WEIGHT BEARING RESTRICTIONS: No  FALLS:  Has patient fallen in last 6 months? Yes. 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   OBJECTIVE: (objective measures completed at initial evaluation unless otherwise dated)  DIAGNOSTIC FINDINGS:  Not available  PATIENT SURVEYS:  LEFS: 32 / 80 = 40.0 %, moderate functional limitation  COGNITION: Overall cognitive status: Within functional limits for tasks assessed    SENSATION: Continued numbness and tingling in L foot, ankle and calf since surgery (slightly better in calf)  EDEMA:  Figure 8: R = 48.0 cm, L = 50.0 cm    POSTURE:  B pes planus in standing  PALPATION: Decreased incisional scar mobility, most notable on medial incision Decreased mid and hindfoot mobility Increased muscle tension/tightness in L gastroc/soleus, fibularis muscles, and anterior tibialis  MUSCLE LENGTH:  Heelcord: Mild tight L>R  LOWER EXTREMITY ROM:  Active ROM Right eval Left eval L 11/21/23  Ankle dorsiflexion 12 1 10   Ankle plantarflexion 71 49 50  Ankle inversion 40 27 38  Ankle eversion 25 12 28    Passive ROM Left eval  Ankle dorsiflexion 6  Ankle  plantarflexion 50  Ankle inversion 34  Ankle eversion 14  (Blank rows = not tested)  LOWER EXTREMITY MMT:  MMT Right eval Left eval R 12/02/23 L  12/02/23  Hip flexion 5 4- 5 5  Hip extension 4+ 3+ 4+ 4+  Hip abduction 4+ 4- 4+ 4+  Hip adduction 4+ 4+ 4+ 4  Hip internal rotation 4+ 4- 4+ 4+  Hip external rotation 4- 3+ 4+ 4+  Knee flexion 5 5    Knee extension 5 4+    Ankle dorsiflexion 5 3+ 5 4+  Ankle plantarflexion 4- (8 SLS HR) 2 17 heel raises 13 Heel raises  Ankle inversion 5 3 5  4+  Ankle eversion 5 3- 5 4   (Blank rows = not tested)  FUNCTIONAL TESTS:  5 times sit to stand: 20.25 sec  SLS: R = 37.66 sec, L = 10.78 sec  GAIT: Distance walked: Clinic distances Assistive device utilized: None Level of assistance: Complete Independence Gait pattern: decreased step length- Right, decreased stance time- Left, decreased ankle dorsiflexion- Left, antalgic, and lateral lean- Right   TODAY'S TREATMENT:  12/02/23 THERAPEUTIC EXERCISE: To improve strength, endurance, ROM, and flexibility.  Demonstration, verbal and tactile cues throughout for technique.  NuStep - L5 x 7 min (UE/LE) Tested strength Seated self-anchored RTB ankle eversion x 20 Seated self-anchored RTB ankle inversion x 20  THERAPEUTIC ACTIVITIES: To improve stair climbing ability  Step ups LLE 8' 2 x 10  Lateral step ups LLE 8' 2 x 10  NEUROMUSCULAR RE-EDUCATION: To improve balance and proprioception Tandem walk on balance beam fwd and back 3x Sidestep with EC on balance beam 3x both ways  11/25/23 THERAPEUTIC EXERCISE: To improve strength, endurance, ROM, and flexibility.  Demonstration, verbal and tactile cues throughout for technique.  NuStep - L6 x 6 min (UE/LE) Seated self-anchored RTB ankle eversion 2 x 10 Seated self-anchored RTB ankle DF 2 x 10 Seated self-anchored RTB ankle inversion 2 x 10 Seated self-anchored blue TB ankle PF 2 x 10 L ankle RTB toe curls 2 x 10  SELF CARE:  Provided education on  post-op incisional scar tissue mobilization and desensitization techniques rubbing with different textures .   THERAPEUTIC ACTIVITIES: To improve functional performance.  Demonstration, verbal and tactile cues throughout for technique. 5xSTS = 13.25 sec  NEUROMUSCULAR RE-EDUCATION: To improve coordination, kinesthesia, proprioception, amplitude of movement, and reduce rigidity. L ankle PNF D1 & D2 diagonals 3 x 10 - 1 set w/o resistance, 2nd set resisting DF diagonals, 3rd set resisting PF diagonals Bridge + RTB B hip ABD/ER clam (maintaining level pelvis) 2 x 10 Bridge + RTB march x 10   11/21/23 THERAPEUTIC EXERCISE: To improve strength and endurance.  Demonstration, verbal and tactile cues throughout for technique.  NuStep - L6 x 6 min (UE/LE) Seated self-anchored RTB ankle eversion 2 x 10 Seated self-anchored RTB ankle DF 2 x 10 Seated self-anchored RTB ankle inversion 2 x 10 Clamshell RTB x 20 R/L in sidelying  NEUROMUSCULAR RE-EDUCATION: To improve balance, coordination, kinesthesia, proprioception, and reduce fall risk. Standing hip abduction RTB at ankles x 10 B Standing hip extension RTB at ankles x 10 B Standing hip ABD to ext toe taps RTB at ankles x 10 B Sidestep 2x along counter RTB at ankles   11/18/23 THERAPEUTIC EXERCISE: To improve strength and endurance.  Demonstration, verbal and tactile cues throughout for technique.  NuStep - L6 x 6 min (UE/LE) Seated self-anchored YTB ankle eversion 2 x 10 Seated self-anchored YTB ankle DF 2 x 10 Seated self-anchored YTB ankle inversion 2 x 10 Seated self-anchored GTB ankle PF 2 x 10  NEUROMUSCULAR RE-EDUCATION: To improve balance, coordination, kinesthesia,  proprioception, and reduce fall risk. Seated BAPS - L3 DF/PF 2 x 10 (keeping sides level) 2.5 weights on lateral, then medial side x 10 each IV/EV 2 x 10 - more difficult CW/CCW x 20 SLS 2 x 30 L - no UE support Standing L DF/knee flexion mobilization on slant board  5x3   11/13/23 THERAPEUTIC EXERCISE: To improve strength and endurance.  Demonstration, verbal and tactile cues throughout for technique.  NuStep - L6 x 6 min (UE/LE) Seated self-anchored YTB ankle eversion 2 x 10 Seated self-anchored YTB ankle DF 2 x 10 Seated self-anchored YTB ankle inversion 2 x 10 Seated self-anchored GTB ankle PF 2 x 10  MANUAL THERAPY: To promote improved flexibility, improved joint mobility, increased ROM, and reduced pain utilizing joint mobilization.  L ankle subtalar and tarsal-metatarsal joint mobs - grade II-III  NEUROMUSCULAR RE-EDUCATION: To improve balance, coordination, kinesthesia, proprioception, and reduce fall risk. Seated BAPS - L3 DF/PF 2 x 10 (keeping sides level) IV/EV 2 x 10 (keeping front/back level) - more difficult CW/CCW x 10 SLS 2 x 30 L - intermittent light support from single pole   11/11/23  THERAPEUTIC EXERCISE: To improve strength, endurance, ROM, and flexibility.  Demonstration, verbal and tactile cues throughout for technique.  S/L clams w/ YTB at distal thighs 2x10 B  Seated Resisted DF AROM w/ YTB 2x10 B  Seated Resisted PF AROM w/ YTB 2x10 B  Seated Resisted ankle eversion w/ YTB 2x10 B   NEURO RE-ED (corner balance) Static Stance x30  Static Stance with eyes closed x30- L ankle feels less steady  Staggered stance x30 B- more difficult w/LLE back  Tandem Stance x30 B- harder with LLE back  SLS x30 B- much more difficult on LLE, pain in front and lateral ankle   SELF CARE: Provided education for HEP review including recommended frequency for home performance.  Reviewed HEP w/ patient  Gastroc Stretch 2x30  Soleus Stretch 2x30  Ankle circles x10 ea direction Heel/Toe Raises x20    10/27/2023  SELF CARE:  Reviewed eval findings and role of PT in addressing identified deficits as well as instruction in initial HEP (see below).    PATIENT EDUCATION:  Education details: HEP review and desensitization techniques  to reduce hypersensitivity  Person educated: Patient Education method: Explanation, Demonstration, Verbal cues, Handouts, and MedBridgeGO app updated Education comprehension: verbalized understanding, returned demonstration, verbal cues required, and needs further education  HOME EXERCISE PROGRAM: *Pt using MedBridgeGO app.  Access Code: [email or web address] URL: [email or web address] Date: 11/25/2023 Prepared by: Elijah Hidden  Exercises - Gastroc Stretch on Wall (Mirrored)  - 2-3 x daily - 7 x weekly - 3 reps - 30 sec hold - Standing Soleus Stretch  - 2-3 x daily - 7 x weekly - 3 reps - 30 sec hold - Ankle Circles in Elevation  - 2-3 x daily - 7 x weekly - 2 sets - 10 reps - Seated Heel Toe Raises  - 2-3 x daily - 7 x weekly - 2 sets - 10 reps - 3 sec hold - Clamshell with Resistance  - 1 x daily - 7 x weekly - 2 sets - 10 reps - Long Sitting Ankle Plantar Flexion with Resistance  - 1 x daily - 7 x weekly - 2 sets - 10 reps - 3 sec hold - Seated Ankle Dorsiflexion with Resistance  - 1 x daily - 7 x weekly - 2 sets - 10 reps - 3 sec hold - Seated Ankle Eversion with Resistance  -  1 x daily - 7 x weekly - 2 sets - 10 reps - 3 sec hold - Seated Ankle Inversion with Resistance  - 1 x daily - 7 x weekly - 2 sets - 10 reps - 3 sec hold - Hip Abduction with Resistance Loop  - 1 x daily - 7 x weekly - 2 sets - 10 reps - Hip Extension with Resistance Loop  - 1 x daily - 7 x weekly - 2 sets - 10 reps  Patient Education - Rubbing with Different Textures   ASSESSMENT:  CLINICAL IMPRESSION: Pt shows improvement in overall LE strength. Notes 70-80% improvement in pain overall. Worked on functional stair climbing and gait on uneven surfaces to prepare for upcoming hiking trip she has this weekend for her business. No pain or issues completing interventions. Cues were needed to correct form and technique to isolate the correct muscles. Danyela will benefit from continued skilled PT to address  ongoing ROM, strength, proprioception and balance deficits to improve mobility and activity tolerance with decreased pain interference.   EVAL: Dana [name] is a 28 y.o. female who was referred to physical therapy for evaluation and treatment s/p correction hallux valgus left foot with metatarsus adductus correction and Akin osteotomy on 07/11/23.  Patient has been released from any postop restrictions and cleared to resume normal activity including running as tolerated.   Patient reports L foot ankle and calf pain persist since surgery.  Pain is worse with prolonged sitting or standing, or walking long distances.  Patient has deficits in L foot/ankle ROM, distal L LE flexibility, L>R LE strength, abnormal posture, and TTP with abnormal muscle tension which are interfering with ADLs and are impacting quality of life.  On LEFS patient scored 32/80 demonstrating moderate functional limitation.  Casidee will benefit from skilled PT to address above deficits to improve mobility and activity tolerance with decreased pain interference prior to proceeding with surgery for R foot.  OBJECTIVE IMPAIRMENTS: Abnormal gait, decreased activity tolerance, decreased balance, decreased endurance, decreased knowledge of condition, decreased mobility, difficulty walking, decreased ROM, decreased strength, hypomobility, increased edema, increased fascial restrictions, impaired perceived functional ability, increased muscle spasms, impaired flexibility, impaired sensation, improper body mechanics, postural dysfunction, pain, and hypermobility.   ACTIVITY LIMITATIONS: carrying, lifting, bending, sitting, standing, squatting, sleeping, stairs, transfers, bathing, locomotion level, and caring for others  PARTICIPATION LIMITATIONS: meal prep, cleaning, laundry, shopping, community activity, occupation, and yard work  PERSONAL FACTORS: Past/current experiences, Profession, Time since onset of injury/illness/exacerbation, and 3+  comorbidities: anxiety, depression, asthma, Ehlers-Danlos/hypermobility syndrome, IBS are also affecting patient's functional outcome.   REHAB POTENTIAL: Good  CLINICAL DECISION MAKING: Evolving/moderate complexity  EVALUATION COMPLEXITY: Moderate   GOALS: Goals reviewed with patient? Yes  SHORT TERM GOALS: Target date: 12/01/2023  Patient will be independent with initial HEP. Baseline: Initial HEP provided on eval Goal status: MET - 11/11/23 - pt is doing HEP consistently   2.  Patient will report at least 25% improvement in L foot/ankle/calf pain to improve QOL. Baseline: 2/10 on eval, up to 8/10 Goal status: MET - 11/18/23  3.  Patient will improve 5x STS time to </= 15 seconds to demonstrate improved functional strength and transfer efficiency.  Baseline: 20.25 sec Goal status: MET - 11/25/23 - 13.25 sec   LONG TERM GOALS: Target date: 01/05/2024  Patient will be independent with advanced/ongoing HEP to improve outcomes and carryover.  Baseline:  Goal status: INITIAL  2.  Patient will report at least 50-75% improvement in  L foot/ankle/calf pain to improve QOL. Baseline: 2/10 on eval, up to 8/10 Goal status: MET- 12/02/23   3.  Patient will demonstrate improved L ankle AROM to [facility] to allow for normal gait and stair mechanics. Baseline: Refer to above LE ROM table Goal status: INITIAL  4.  Patient will demonstrate improved B LE strength to >/= 4+/5 with exception of L PF to >/= 3+/5 for improved stability and ease of mobility. Baseline: Refer to above LE MMT table Goal status: 12/02/23 partially MET  5.  Patient will be able to ambulate ~1 mile with normal gait pattern without increased foot/ankle pain to access community.  Baseline: Antalgic gait pattern with gait distance limited to only a few blocks Goal status: INITIAL  6. Patient will be able to ascend/descend stairs with 1 HR and reciprocal step pattern safely to access home and community.  Baseline:  Goal status:  INITIAL  7.  Patient will report >/= 45/80 on LEFS to demonstrate improved functional ability. Baseline: 32 / 80 = 40.0 % Goal status: INITIAL  8.  Patient will improve L SLS to >/= 20 sec to demonstrate improved L ankle stability. Baseline: 10.78 sec Goal status: IN PROGRESS - 11/13/23 - L SLS with 2 x 30 with intermittent light 1 pole    PLAN:  PT FREQUENCY: 2x/week  PT DURATION: 8-10 weeks   PLANNED INTERVENTIONS: 02835- PT Re-evaluation, 97750- Physical Performance Testing, 97110-Therapeutic exercises, 97530- Therapeutic activity, 97112- Neuromuscular re-education, 97535- Self Care, 02859- Manual therapy, [phone]- Gait training, [phone]- Aquatic Therapy, [phone]- Electrical stimulation (unattended), [id number]- Vasopneumatic device, [id number]- Ultrasound, [id number]- Ionotophoresis 4mg /ml Dexamethasone , 79439 (1-2 muscles), 20561 (3+ muscles)- Dry Needling, Patient/Family education, Balance training, Stair training, Taping, Joint mobilization, Scar mobilization, DME instructions, Cryotherapy, and Moist heat  PLAN FOR NEXT SESSION: MT including STM/joint mobilizations PRN to reduce edema and improve L ankle ROM; L ankle P/AROM; gently progress L ankle and proximal LE strengthening - update HEP accordingly; continue proprioceptive and balance training   Sol LITTIE Gaskins, PTA 12/02/2023, 8:56 AM

## 2023-12-03 DIAGNOSIS — Z713 Dietary counseling and surveillance: Secondary | ICD-10-CM | POA: Diagnosis not present

## 2023-12-05 DIAGNOSIS — Z419 Encounter for procedure for purposes other than remedying health state, unspecified: Secondary | ICD-10-CM | POA: Diagnosis not present

## 2023-12-08 DIAGNOSIS — F411 Generalized anxiety disorder: Secondary | ICD-10-CM | POA: Diagnosis not present

## 2023-12-15 DIAGNOSIS — F411 Generalized anxiety disorder: Secondary | ICD-10-CM | POA: Diagnosis not present

## 2023-12-17 ENCOUNTER — Ambulatory Visit

## 2023-12-17 DIAGNOSIS — M25572 Pain in left ankle and joints of left foot: Secondary | ICD-10-CM

## 2023-12-17 DIAGNOSIS — R600 Localized edema: Secondary | ICD-10-CM

## 2023-12-17 DIAGNOSIS — R2689 Other abnormalities of gait and mobility: Secondary | ICD-10-CM | POA: Diagnosis not present

## 2023-12-17 DIAGNOSIS — Z713 Dietary counseling and surveillance: Secondary | ICD-10-CM | POA: Diagnosis not present

## 2023-12-17 DIAGNOSIS — M6281 Muscle weakness (generalized): Secondary | ICD-10-CM | POA: Diagnosis not present

## 2023-12-17 NOTE — Therapy (Signed)
 OUTPATIENT PHYSICAL THERAPY LOWER EXTREMITY TREATMENT   Patient Name: Dana [name] MRN: [id number] DOB:02/17/1996, 28 y.o., female Today's Date: 12/17/2023   END OF SESSION:  PT End of Session - 12/17/23 0812     Visit Number 8    Date for Recertification  01/05/24    Authorization Type BCBS & [facility] Medicaid    PT Start Time 0809   pt late   PT Stop Time 0845    PT Time Calculation (min) 36 min    Activity Tolerance Patient tolerated treatment well    Behavior During Therapy [facility] for tasks assessed/performed            Past Medical History:  Diagnosis Date   Anxiety    Anxiety disorder of adolescence 06/09/2015   Arm fracture    Asthma    Constipation    alternates with diarrhea   Depression    Diarrhea    alternates with constipation   Ehlers-Danlos syndrome    per patient   GERD (gastroesophageal reflux disease)    Inflammatory bowel disease    Insomnia 06/09/2015   Past Surgical History:  Procedure Laterality Date   FOOT ARTHRODESIS Left 07/11/2023   Procedure: FUSION, JOINT, FOOT;  Surgeon: Silva Juliene SAUNDERS, DPM;  Location: [facility] ORS;  Service: Podiatry;  Laterality: Left;  MID FOOT FUSION   HALLUX VALGUS AKIN Left 07/11/2023   Procedure: CORRECTION, HALLUX VALGUS, WITH AKIN OSTEOTOMY;  Surgeon: Silva Juliene SAUNDERS, DPM;  Location: [facility] OR[facility];  Service: Podiatry;  Laterality: Left;  GENERAL WITH BLOCK   HALLUX VALGUS LAPIDUS Left 07/11/2023   Procedure: ROMAYNE LOOK;  Surgeon: Silva Juliene SAUNDERS, DPM;  Location: [facility] OR[facility];  Service: Podiatry;  Laterality: Left;   NO PAST SURGERIES     Patient Active Problem List   Diagnosis Date Noted   Metatarsus primus varus of left foot 07/13/2023   Acquired hallux valgus of left foot 07/13/2023   Metatarsus adductus of left foot 07/13/2023   Major osseous defect 07/13/2023   Otalgia of both ears 03/06/2023   Body aches 03/06/2023   Hypermobility syndrome 02/18/2023   Suicidal ideation 12/19/2015   MDD (major  depressive disorder), recurrent episode, severe (HCC) 12/18/2015   Insomnia 06/09/2015   Anxiety disorder of adolescence 06/09/2015   Severe episode of recurrent major depressive disorder, without psychotic features (HCC) 06/02/2015   Alternating constipation and diarrhea    Nausea & vomiting 09/29/2011   IBS (irritable bowel syndrome) 06/28/2011   GERD (gastroesophageal reflux disease) 06/28/2011   Back pain 08/01/2010   DYSURIA 08/17/2009   ACUTE PHARYNGITIS 04/06/2009   ACUTE BRONCHITIS 04/06/2009   Acute gastritis 04/27/2007   Anxiety state 02/11/2007   Allergic rhinitis 02/11/2007   GERD 12/25/2006   LOOSE STOOLS 12/25/2006   RUQ PAIN 12/25/2006    PCP: Antonio Cyndee Jamee SAUNDERS, DO   REFERRING PROVIDER: Silva Juliene SAUNDERS, DPM   REFERRING DIAG:  M20.12,M21.612 (ICD-10-CM) - Hallux valgus with bunions, left  Q66.229 (ICD-10-CM) - Metatarsus adductus, congenital   Postop bunionectomy 3 months ago difficulty ambulating and swelling in ankle, work on joint manipulation range of motion strengthening and gait training. Evaluate and treat modalities as needed at your discretion twice weekly for 6 to 8 weeks.   THERAPY DIAG:  Pain in left ankle and joints of left foot  Muscle weakness (generalized)  Other abnormalities of gait and mobility  Localized edema  RATIONALE FOR EVALUATION AND TREATMENT: Rehabilitation  ONSET DATE: 07/11/2023 Surgery/procedure:  LOOK Bunionectomy Correction metatarsus  adductus with 2nd and 3rd TMT fusion with osteotomy Akin phalangeal osteotomy  NEXT MD VISIT: 11/20/23   SUBJECTIVE:                                                                                                                                                                                                         SUBJECTIVE STATEMENT: Pt reports she went hiking a couple of weeks ago, she reports it was difficult as she was walking three dogs as well.  PAIN: Are you having  pain? No and Yes: NPRS scale: 3/10  Pain location: L ankle, but sometimes also in calf and foot  Pain description: sharp, burning  Aggravating factors: prolonged sitting or standing, walking more than a couple blocks  Relieving factors: elevation, rest, ice   PERTINENT HISTORY:  anxiety, depression, asthma, Ehlers-Danlos/hypermobility syndrome, IBS  PRECAUTIONS: None - No restrictions on activity. Regular shoe gear as tolerated.   RED FLAGS: None  WEIGHT BEARING RESTRICTIONS: No  FALLS:  Has patient fallen in last 6 months? Yes. 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   OBJECTIVE: (objective measures completed at initial evaluation unless otherwise dated)  DIAGNOSTIC FINDINGS:  Not available  PATIENT SURVEYS:  LEFS: 32 / 80 = 40.0 %, moderate functional limitation  COGNITION: Overall cognitive status: Within functional limits for tasks assessed    SENSATION: Continued numbness and tingling in L foot, ankle and calf since surgery (slightly better in calf)  EDEMA:  Figure 8: R = 48.0 cm, L = 50.0 cm    POSTURE:  B pes planus in standing  PALPATION: Decreased incisional scar mobility, most notable on medial incision Decreased mid and hindfoot mobility Increased muscle tension/tightness in L gastroc/soleus, fibularis muscles, and anterior tibialis  MUSCLE LENGTH:  Heelcord: Mild tight L>R  LOWER EXTREMITY ROM:  Active ROM Right eval Left eval L 11/21/23 L 12/17/23  Ankle dorsiflexion 12 1 10 15   Ankle  plantarflexion 71 49 50 60  Ankle inversion 40 27 38 36  Ankle eversion 25 12 28 30    Passive ROM Left eval  Ankle dorsiflexion 6  Ankle plantarflexion  50  Ankle inversion 34  Ankle eversion 14  (Blank rows = not tested)  LOWER EXTREMITY MMT:  MMT Right eval Left eval R 12/02/23 L 12/02/23  Hip flexion 5 4- 5 5  Hip extension 4+ 3+ 4+ 4+  Hip abduction 4+ 4- 4+ 4+  Hip adduction 4+ 4+ 4+ 4  Hip internal rotation 4+ 4- 4+ 4+  Hip external rotation 4- 3+ 4+ 4+  Knee flexion 5 5    Knee extension 5 4+    Ankle dorsiflexion 5 3+ 5 4+  Ankle plantarflexion 4- (8 SLS HR) 2 17 heel raises 13 Heel raises  Ankle inversion 5 3 5  4+  Ankle eversion 5 3- 5 4   (Blank rows = not tested)  FUNCTIONAL TESTS:  5 times sit to stand: 20.25 sec  SLS: R = 37.66 sec, L = 10.78 sec  GAIT: Distance walked: Clinic distances Assistive device utilized: None Level of assistance: Complete Independence Gait pattern: decreased step length- Right, decreased stance time- Left, decreased ankle dorsiflexion- Left, antalgic, and lateral lean- Right   TODAY'S TREATMENT:  12/17/23 NEUROMUSCULAR RE-EDUCATION: To improve coordination, kinesthesia, posture, and proprioception.  Step ups BOSU LLE x 10 Lateral step ups BOSU LLE x 10 Standing balance on BOSU BLE 2x30  THERAPEUTIC EXERCISE: To improve strength, endurance, ROM, and flexibility.  Nustep L6x2min Assessment of stairs: [facility], patient reports slight increase in pain w/ stairs Toe curls with towel on floor x 20 Standing gastroc and soleus stretch x 30' each with blue band dorsal glide Standing calf raises from 2' book   12/02/23 THERAPEUTIC EXERCISE: To improve strength, endurance, ROM, and flexibility.  Demonstration, verbal and tactile cues throughout for technique.  NuStep - L5 x 7 min (UE/LE) Tested strength Seated self-anchored RTB ankle eversion x 20 Seated self-anchored RTB ankle inversion x 20  THERAPEUTIC ACTIVITIES: To improve stair  climbing ability  Step ups LLE 8' 2 x 10  Lateral step ups LLE 8' 2 x 10  NEUROMUSCULAR RE-EDUCATION: To improve balance and proprioception Tandem walk on balance beam fwd and back 3x Sidestep with EC on balance beam 3x both ways  11/25/23 THERAPEUTIC EXERCISE: To improve strength, endurance, ROM, and flexibility.  Demonstration, verbal and tactile cues throughout for technique.  NuStep - L6 x 6 min (UE/LE) Seated self-anchored RTB ankle eversion 2 x 10 Seated self-anchored RTB ankle DF 2 x 10 Seated self-anchored RTB ankle inversion 2 x 10 Seated self-anchored blue TB ankle PF 2 x 10 L ankle RTB toe curls 2 x 10  SELF CARE:  Provided education on post-op incisional scar tissue mobilization and desensitization techniques rubbing with different textures .   THERAPEUTIC ACTIVITIES: To improve functional performance.  Demonstration, verbal and tactile cues throughout for technique. 5xSTS = 13.25 sec  NEUROMUSCULAR RE-EDUCATION: To improve coordination, kinesthesia, proprioception, amplitude of movement, and reduce rigidity. L ankle PNF D1 & D2 diagonals 3 x 10 - 1 set w/o resistance, 2nd set resisting DF diagonals, 3rd set resisting PF diagonals Bridge + RTB B hip ABD/ER clam (maintaining level pelvis) 2 x 10 Bridge + RTB march x 10   11/21/23 THERAPEUTIC EXERCISE: To improve strength and endurance.  Demonstration, verbal and tactile cues throughout for technique.  NuStep - L6 x 6 min (UE/LE) Seated self-anchored RTB ankle eversion 2 x 10 Seated self-anchored RTB ankle DF 2 x 10 Seated self-anchored RTB ankle inversion 2 x 10 Clamshell RTB x 20 R/L in sidelying  NEUROMUSCULAR RE-EDUCATION: To improve balance,  coordination, kinesthesia, proprioception, and reduce fall risk. Standing hip abduction RTB at ankles x 10 B Standing hip extension RTB at ankles x 10 B Standing hip ABD to ext toe taps RTB at ankles x 10 B Sidestep 2x along counter RTB at ankles   11/18/23 THERAPEUTIC  EXERCISE: To improve strength and endurance.  Demonstration, verbal and tactile cues throughout for technique.  NuStep - L6 x 6 min (UE/LE) Seated self-anchored YTB ankle eversion 2 x 10 Seated self-anchored YTB ankle DF 2 x 10 Seated self-anchored YTB ankle inversion 2 x 10 Seated self-anchored GTB ankle PF 2 x 10  NEUROMUSCULAR RE-EDUCATION: To improve balance, coordination, kinesthesia, proprioception, and reduce fall risk. Seated BAPS - L3 DF/PF 2 x 10 (keeping sides level) 2.5 weights on lateral, then medial side x 10 each IV/EV 2 x 10 - more difficult CW/CCW x 20 SLS 2 x 30 L - no UE support Standing L DF/knee flexion mobilization on slant board 5x3   11/13/23 THERAPEUTIC EXERCISE: To improve strength and endurance.  Demonstration, verbal and tactile cues throughout for technique.  NuStep - L6 x 6 min (UE/LE) Seated self-anchored YTB ankle eversion 2 x 10 Seated self-anchored YTB ankle DF 2 x 10 Seated self-anchored YTB ankle inversion 2 x 10 Seated self-anchored GTB ankle PF 2 x 10  MANUAL THERAPY: To promote improved flexibility, improved joint mobility, increased ROM, and reduced pain utilizing joint mobilization.  L ankle subtalar and tarsal-metatarsal joint mobs - grade II-III  NEUROMUSCULAR RE-EDUCATION: To improve balance, coordination, kinesthesia, proprioception, and reduce fall risk. Seated BAPS - L3 DF/PF 2 x 10 (keeping sides level) IV/EV 2 x 10 (keeping front/back level) - more difficult CW/CCW x 10 SLS 2 x 30 L - intermittent light support from single pole   11/11/23  THERAPEUTIC EXERCISE: To improve strength, endurance, ROM, and flexibility.  Demonstration, verbal and tactile cues throughout for technique.  S/L clams w/ YTB at distal thighs 2x10 B  Seated Resisted DF AROM w/ YTB 2x10 B  Seated Resisted PF AROM w/ YTB 2x10 B  Seated Resisted ankle eversion w/ YTB 2x10 B   NEURO RE-ED (corner balance) Static Stance x30  Static Stance with eyes closed  x30- L ankle feels less steady  Staggered stance x30 B- more difficult w/LLE back  Tandem Stance x30 B- harder with LLE back  SLS x30 B- much more difficult on LLE, pain in front and lateral ankle   SELF CARE: Provided education for HEP review including recommended frequency for home performance.  Reviewed HEP w/ patient  Gastroc Stretch 2x30  Soleus Stretch 2x30  Ankle circles x10 ea direction Heel/Toe Raises x20    10/27/2023  SELF CARE:  Reviewed eval findings and role of PT in addressing identified deficits as well as instruction in initial HEP (see below).    PATIENT EDUCATION:  Education details: HEP review and desensitization techniques to reduce hypersensitivity  Person educated: Patient Education method: Explanation, Demonstration, Verbal cues, Handouts, and MedBridge[employer] app updated Education comprehension: verbalized understanding, returned demonstration, verbal cues required, and needs further education  HOME EXERCISE PROGRAM: *Pt using MedBridge[employer] app.  Access Code: [email or web address] URL: [email or web address] Date: 11/25/2023 Prepared by: Elijah Hidden  Exercises - Gastroc Stretch on Wall (Mirrored)  - 2-3 x daily - 7 x weekly - 3 reps - 30 sec hold - Standing Soleus Stretch  - 2-3 x daily - 7 x weekly - 3 reps - 30 sec hold - Ankle Circles in Elevation  - 2-3 x  daily - 7 x weekly - 2 sets - 10 reps - Seated Heel Toe Raises  - 2-3 x daily - 7 x weekly - 2 sets - 10 reps - 3 sec hold - Clamshell with Resistance  - 1 x daily - 7 x weekly - 2 sets - 10 reps - Long Sitting Ankle Plantar Flexion with Resistance  - 1 x daily - 7 x weekly - 2 sets - 10 reps - 3 sec hold - Seated Ankle Dorsiflexion with Resistance  - 1 x daily - 7 x weekly - 2 sets - 10 reps - 3 sec hold - Seated Ankle Eversion with Resistance  - 1 x daily - 7 x weekly - 2 sets - 10 reps - 3 sec hold - Seated Ankle Inversion with Resistance  - 1 x daily - 7 x weekly - 2 sets - 10 reps - 3 sec  hold - Hip Abduction with Resistance Loop  - 1 x daily - 7 x weekly - 2 sets - 10 reps - Hip Extension with Resistance Loop  - 1 x daily - 7 x weekly - 2 sets - 10 reps  Patient Education - Rubbing with Different Textures   ASSESSMENT:  CLINICAL IMPRESSION: Pt responded well to treatment. Her ROM is WFL as well as stair navigation with some pain. Progressed interventions with cuing throughout session. She continues to have difficulty with more functional strengthening such as stair climbing, and higher level balance activities. [place] will benefit from continued skilled PT to address ongoing ROM, strength, proprioception and balance deficits to improve mobility and activity tolerance with decreased pain interference.   EVAL: Jisell L Soutar is a 28 y.o. female who was referred to physical therapy for evaluation and treatment s/p correction hallux valgus left foot with metatarsus adductus correction and Akin osteotomy on 07/11/23.  Patient has been released from any postop restrictions and cleared to resume normal activity including running as tolerated.   Patient reports L foot ankle and calf pain persist since surgery.  Pain is worse with prolonged sitting or standing, or walking long distances.  Patient has deficits in L foot/ankle ROM, distal L LE flexibility, L>R LE strength, abnormal posture, and TTP with abnormal muscle tension which are interfering with ADLs and are impacting quality of life.  On LEFS patient scored 32/80 demonstrating moderate functional limitation.  [place] will benefit from skilled PT to address above deficits to improve mobility and activity tolerance with decreased pain interference prior to proceeding with surgery for R foot.  OBJECTIVE IMPAIRMENTS: Abnormal gait, decreased activity tolerance, decreased balance, decreased endurance, decreased knowledge of condition, decreased mobility, difficulty walking, decreased ROM, decreased strength, hypomobility, increased edema,  increased fascial restrictions, impaired perceived functional ability, increased muscle spasms, impaired flexibility, impaired sensation, improper body mechanics, postural dysfunction, pain, and hypermobility.   ACTIVITY LIMITATIONS: carrying, lifting, bending, sitting, standing, squatting, sleeping, stairs, transfers, bathing, locomotion level, and caring for others  PARTICIPATION LIMITATIONS: meal prep, cleaning, laundry, shopping, community activity, occupation, and yard work  PERSONAL FACTORS: Past/current experiences, Profession, Time since onset of injury/illness/exacerbation, and 3+ comorbidities: anxiety, depression, asthma, Ehlers-Danlos/hypermobility syndrome, IBS are also affecting patient's functional outcome.   REHAB POTENTIAL: Good  CLINICAL DECISION MAKING: Evolving/moderate complexity  EVALUATION COMPLEXITY: Moderate   GOALS: Goals reviewed with patient? Yes  SHORT TERM GOALS: Target date: 12/01/2023  Patient will be independent with initial HEP. Baseline: Initial HEP provided on eval Goal status: MET - 11/11/23 - pt is doing HEP consistently  2.  Patient will report at least 25% improvement in L foot/ankle/calf pain to improve QOL. Baseline: 2/10 on eval, up to 8/10 Goal status: MET - 11/18/23  3.  Patient will improve 5x STS time to </= 15 seconds to demonstrate improved functional strength and transfer efficiency.  Baseline: 20.25 sec Goal status: MET - 11/25/23 - 13.25 sec   LONG TERM GOALS: Target date: 01/05/2024  Patient will be independent with advanced/ongoing HEP to improve outcomes and carryover.  Baseline:  Goal status: INITIAL  2.  Patient will report at least 50-75% improvement in L foot/ankle/calf pain to improve QOL. Baseline: 2/10 on eval, up to 8/10 Goal status: MET- 12/02/23   3.  Patient will demonstrate improved L ankle AROM to [facility] to allow for normal gait and stair mechanics. Baseline: Refer to above LE ROM table Goal status: MET-  12/17/23  4.  Patient will demonstrate improved B LE strength to >/= 4+/5 with exception of L PF to >/= 3+/5 for improved stability and ease of mobility. Baseline: Refer to above LE MMT table Goal status: 12/02/23 partially MET  5.  Patient will be able to ambulate ~1 mile with normal gait pattern without increased foot/ankle pain to access community.  Baseline: Antalgic gait pattern with gait distance limited to only a few blocks Goal status: INITIAL  6. Patient will be able to ascend/descend stairs with 1 HR and reciprocal step pattern safely to access home and community.  Baseline:  Goal status: PARTIALLY MET- 12/17/23 increased pain with stairs   7.  Patient will report >/= 45/80 on LEFS to demonstrate improved functional ability. Baseline: 32 / 80 = 40.0 % Goal status: INITIAL  8.  Patient will improve L SLS to >/= 20 sec to demonstrate improved L ankle stability. Baseline: 10.78 sec Goal status: IN PROGRESS - 11/13/23 - L SLS with 2 x 30 with intermittent light 1 pole    PLAN:  PT FREQUENCY: 2x/week  PT DURATION: 8-10 weeks   PLANNED INTERVENTIONS: 02835- PT Re-evaluation, 97750- Physical Performance Testing, 97110-Therapeutic exercises, 97530- Therapeutic activity, 97112- Neuromuscular re-education, 97535- Self Care, 02859- Manual therapy, [phone]- Gait training, [phone]- Aquatic Therapy, [phone]- Electrical stimulation (unattended), 97016- Vasopneumatic device, [id number]- Ultrasound, [id number]- Ionotophoresis 4mg /ml Dexamethasone , 79439 (1-2 muscles), 20561 (3+ muscles)- Dry Needling, Patient/Family education, Balance training, Stair training, Taping, Joint mobilization, Scar mobilization, DME instructions, Cryotherapy, and Moist heat  PLAN FOR NEXT SESSION: BOSU step ups, balance beam exercises   Sol LITTIE Gaskins, PTA 12/17/2023, 9:39 AM

## 2023-12-19 ENCOUNTER — Ambulatory Visit

## 2023-12-19 DIAGNOSIS — M25572 Pain in left ankle and joints of left foot: Secondary | ICD-10-CM

## 2023-12-19 DIAGNOSIS — R2689 Other abnormalities of gait and mobility: Secondary | ICD-10-CM | POA: Diagnosis not present

## 2023-12-19 DIAGNOSIS — R600 Localized edema: Secondary | ICD-10-CM

## 2023-12-19 DIAGNOSIS — M6281 Muscle weakness (generalized): Secondary | ICD-10-CM

## 2023-12-19 NOTE — Therapy (Signed)
 OUTPATIENT PHYSICAL THERAPY LOWER EXTREMITY TREATMENT   Patient Name: Dana [name] MRN: [id number] DOB:11-25-1995, 28 y.o., female Today's Date: 12/19/2023   END OF SESSION:  PT End of Session - 12/19/23 0812     Visit Number 9    Date for Recertification  01/05/24    Authorization Type [employer]BS & [facility] Medicaid    PT Start Time 0802    PT Stop Time 0848    PT Time Calculation (min) 46 min    Activity Tolerance Patient tolerated treatment well    Behavior During Therapy [facility] for tasks assessed/performed             Past Medical History:  Diagnosis Date   Anxiety    Anxiety disorder of adolescence 06/09/2015   Arm fracture    Asthma    Constipation    alternates with diarrhea   Depression    Diarrhea    alternates with constipation   Ehlers-Danlos syndrome    per patient   GERD (gastroesophageal reflux disease)    Inflammatory bowel disease    Insomnia 06/09/2015   Past Surgical History:  Procedure Laterality Date   FOOT ARTHRODESIS Left 07/11/2023   Procedure: FUSION, JOINT, FOOT;  Surgeon: Silva Juliene SAUNDERS, DPM;  Location: [facility] OR[facility];  Service: Podiatry;  Laterality: Left;  MID FOOT FUSION   HALLUX VALGUS AKIN Left 07/11/2023   Procedure: CORRECTION, HALLUX VALGUS, WITH AKIN OSTEOTOMY;  Surgeon: Silva Juliene SAUNDERS, DPM;  Location: [facility];  Service: Podiatry;  Laterality: Left;  GENERAL WITH BLOCK   HALLUX VALGUS LAPIDUS Left 07/11/2023   Procedure: ROMAYNE LOOK;  Surgeon: Silva Juliene SAUNDERS, DPM;  Location: [facility];  Service: Podiatry;  Laterality: Left;   NO PAST SURGERIES     Patient Active Problem List   Diagnosis Date Noted   Metatarsus primus varus of left foot 07/13/2023   Acquired hallux valgus of left foot 07/13/2023   Metatarsus adductus of left foot 07/13/2023   Major osseous defect 07/13/2023   Otalgia of both ears 03/06/2023   Body aches 03/06/2023   Hypermobility syndrome 02/18/2023   Suicidal ideation 12/19/2015   MDD (major  depressive disorder), recurrent episode, severe (HCC) 12/18/2015   Insomnia 06/09/2015   Anxiety disorder of adolescence 06/09/2015   Severe episode of recurrent major depressive disorder, without psychotic features (HCC) 06/02/2015   Alternating constipation and diarrhea    Nausea & vomiting 09/29/2011   IBS (irritable bowel syndrome) 06/28/2011   GERD (gastroesophageal reflux disease) 06/28/2011   Back pain 08/01/2010   DYSURIA 08/17/2009   ACUTE PHARYNGITIS 04/06/2009   ACUTE BRONCHITIS 04/06/2009   Acute gastritis 04/27/2007   Anxiety state 02/11/2007   Allergic rhinitis 02/11/2007   GERD 12/25/2006   LOOSE STOOLS 12/25/2006   RUQ PAIN 12/25/2006    PCP: Antonio Cyndee Jamee SAUNDERS, DO   REFERRING PROVIDER: Silva Juliene SAUNDERS, DPM   REFERRING DIAG:  M20.12,M21.612 (ICD-10-CM) - Hallux valgus with bunions, left  Q66.229 (ICD-10-CM) - Metatarsus adductus, congenital   Postop bunionectomy 3 months ago difficulty ambulating and swelling in ankle, work on joint manipulation range of motion strengthening and gait training. Evaluate and treat modalities as needed at your discretion twice weekly for 6 to 8 weeks.   THERAPY DIAG:  Pain in left ankle and joints of left foot  Muscle weakness (generalized)  Other abnormalities of gait and mobility  Localized edema  RATIONALE FOR EVALUATION AND TREATMENT: Rehabilitation  ONSET DATE: 07/11/2023 Surgery/procedure:  LOOK Bunionectomy Correction metatarsus adductus with  2nd and 3rd TMT fusion with osteotomy Akin phalangeal osteotomy  NEXT MD VISIT: 11/20/23   SUBJECTIVE:                                                                                                                                                                                                         SUBJECTIVE STATEMENT: Pt reports pain not too bad   PAIN: Are you having pain? No and Yes: NPRS scale: 0/10  Pain location: L ankle, but sometimes also in calf and  foot  Pain description: sharp, burning  Aggravating factors: prolonged sitting or standing, walking more than a couple blocks  Relieving factors: elevation, rest, ice   PERTINENT HISTORY:  anxiety, depression, asthma, Ehlers-Danlos/hypermobility syndrome, IBS  PRECAUTIONS: None - No restrictions on activity. Regular shoe gear as tolerated.   RED FLAGS: None  WEIGHT BEARING RESTRICTIONS: No  FALLS:  Has patient fallen in last 6 months? Yes. 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   OBJECTIVE: (objective measures completed at initial evaluation unless otherwise dated)  DIAGNOSTIC FINDINGS:  Not available  PATIENT SURVEYS:  LEFS: 32 / 80 = 40.0 %, moderate functional limitation  COGNITION: Overall cognitive status: Within functional limits for tasks assessed    SENSATION: Continued numbness and tingling in L foot, ankle and calf since surgery (slightly better in calf)  EDEMA:  Figure 8: R = 48.0 cm, L = 50.0 cm    POSTURE:  B pes planus in standing  PALPATION: Decreased incisional scar mobility, most notable on medial incision Decreased mid and hindfoot mobility Increased muscle tension/tightness in L gastroc/soleus, fibularis muscles, and anterior tibialis  MUSCLE LENGTH:  Heelcord: Mild tight L>R  LOWER EXTREMITY ROM:  Active ROM Right eval Left eval L 11/21/23 L 12/17/23  Ankle dorsiflexion 12 1 10 15   Ankle plantarflexion 71 49 50 60  Ankle inversion 40 27 38 36  Ankle eversion 25 12 28 30    Passive  ROM Left eval  Ankle dorsiflexion 6  Ankle plantarflexion 50  Ankle inversion 34  Ankle eversion 14  (Blank rows = not tested)  LOWER EXTREMITY  MMT:  MMT Right eval Left eval R 12/02/23 L 12/02/23  Hip flexion 5 4- 5 5  Hip extension 4+ 3+ 4+ 4+  Hip abduction 4+ 4- 4+ 4+  Hip adduction 4+ 4+ 4+ 4  Hip internal rotation 4+ 4- 4+ 4+  Hip external rotation 4- 3+ 4+ 4+  Knee flexion 5 5    Knee extension 5 4+    Ankle dorsiflexion 5 3+ 5 4+  Ankle plantarflexion 4- (8 SLS HR) 2 17 heel raises 13 Heel raises  Ankle inversion 5 3 5  4+  Ankle eversion 5 3- 5 4   (Blank rows = not tested)  FUNCTIONAL TESTS:  5 times sit to stand: 20.25 sec  SLS: R = 37.66 sec, L = 10.78 sec  GAIT: Distance walked: Clinic distances Assistive device utilized: None Level of assistance: Complete Independence Gait pattern: decreased step length- Right, decreased stance time- Left, decreased ankle dorsiflexion- Left, antalgic, and lateral lean- Right   TODAY'S TREATMENT:  12/17/23 NEUROMUSCULAR RE-EDUCATION: To improve coordination, kinesthesia, posture, and proprioception.  Step ups BOSU LLE x 20 Lateral step ups BOSU LLE x 20 Resisted gait black TB with perturbations side to side 2x150'; additional 150 ft with occasional reactionary balance Fwd/retro gait walking on unsteady surfaces 10x each way- some LOB with retro stepping [place] on unsteady surface 3x each way not as hard  THERAPEUTIC EXERCISE: To improve strength, endurance, ROM, and flexibility.  Nustep L5x68min Seated ankle EV and IV x 20 GTB Calf raise from 2' book x 20   12/17/23 NEUROMUSCULAR RE-EDUCATION: To improve coordination, kinesthesia, posture, and proprioception.  Step ups BOSU LLE x 10 Lateral step ups BOSU LLE x 10 Standing balance on BOSU BLE 2x30  THERAPEUTIC EXERCISE: To improve strength, endurance, ROM, and flexibility.  Nustep L6x26min Assessment of stairs: [facility], patient reports slight increase in pain w/ stairs Toe  curls with towel on floor x 20 Standing gastroc and soleus stretch x 30' each with blue band dorsal glide Standing calf raises from 2' book   12/02/23 THERAPEUTIC EXERCISE: To improve strength, endurance, ROM, and flexibility.  Demonstration, verbal and tactile cues throughout for technique.  NuStep - L5 x 7 min (UE/LE) Tested strength Seated self-anchored RTB ankle eversion x 20 Seated self-anchored RTB ankle inversion x 20  THERAPEUTIC ACTIVITIES: To improve stair climbing ability  Step ups LLE 8' 2 x 10  Lateral step ups LLE 8' 2 x 10  NEUROMUSCULAR RE-EDUCATION: To improve balance and proprioception Tandem walk on balance beam fwd and back 3x Sidestep with EC on balance beam 3x both ways  11/25/23 THERAPEUTIC EXERCISE: To improve strength, endurance, ROM, and flexibility.  Demonstration, verbal and tactile cues throughout for technique.  NuStep - L6 x 6 min (UE/LE) Seated self-anchored RTB ankle eversion 2 x 10 Seated self-anchored RTB ankle DF 2 x 10 Seated self-anchored RTB ankle inversion 2 x 10 Seated self-anchored blue TB ankle PF 2 x 10 L ankle RTB toe curls 2 x 10  SELF CARE:  Provided education on post-op incisional scar tissue mobilization and desensitization techniques rubbing with different textures .   THERAPEUTIC ACTIVITIES: To improve functional performance.  Demonstration, verbal and tactile cues throughout for technique. 5xSTS = 13.25 sec  NEUROMUSCULAR RE-EDUCATION: To improve coordination, kinesthesia, proprioception, amplitude of movement, and reduce rigidity. L ankle PNF D1 & D2 diagonals 3 x 10 - 1 set w/o resistance, 2nd set resisting DF diagonals, 3rd set resisting PF diagonals Bridge + RTB B hip ABD/ER  clam (maintaining level pelvis) 2 x 10 Bridge + RTB march x 10   11/21/23 THERAPEUTIC EXERCISE: To improve strength and endurance.  Demonstration, verbal and tactile cues throughout for technique.  NuStep - L6 x 6 min (UE/LE) Seated self-anchored RTB  ankle eversion 2 x 10 Seated self-anchored RTB ankle DF 2 x 10 Seated self-anchored RTB ankle inversion 2 x 10 Clamshell RTB x 20 R/L in sidelying  NEUROMUSCULAR RE-EDUCATION: To improve balance, coordination, kinesthesia, proprioception, and reduce fall risk. Standing hip abduction RTB at ankles x 10 B Standing hip extension RTB at ankles x 10 B Standing hip ABD to ext toe taps RTB at ankles x 10 B Sidestep 2x along counter RTB at ankles   11/18/23 THERAPEUTIC EXERCISE: To improve strength and endurance.  Demonstration, verbal and tactile cues throughout for technique.  NuStep - L6 x 6 min (UE/LE) Seated self-anchored YTB ankle eversion 2 x 10 Seated self-anchored YTB ankle DF 2 x 10 Seated self-anchored YTB ankle inversion 2 x 10 Seated self-anchored GTB ankle PF 2 x 10  NEUROMUSCULAR RE-EDUCATION: To improve balance, coordination, kinesthesia, proprioception, and reduce fall risk. Seated BAPS - L3 DF/PF 2 x 10 (keeping sides level) 2.5 weights on lateral, then medial side x 10 each IV/EV 2 x 10 - more difficult CW/CCW x 20 SLS 2 x 30 L - no UE support Standing L DF/knee flexion mobilization on slant board 5x3   11/13/23 THERAPEUTIC EXERCISE: To improve strength and endurance.  Demonstration, verbal and tactile cues throughout for technique.  NuStep - L6 x 6 min (UE/LE) Seated self-anchored YTB ankle eversion 2 x 10 Seated self-anchored YTB ankle DF 2 x 10 Seated self-anchored YTB ankle inversion 2 x 10 Seated self-anchored GTB ankle PF 2 x 10  MANUAL THERAPY: To promote improved flexibility, improved joint mobility, increased ROM, and reduced pain utilizing joint mobilization.  L ankle subtalar and tarsal-metatarsal joint mobs - grade II-III  NEUROMUSCULAR RE-EDUCATION: To improve balance, coordination, kinesthesia, proprioception, and reduce fall risk. Seated BAPS - L3 DF/PF 2 x 10 (keeping sides level) IV/EV 2 x 10 (keeping front/back level) - more difficult CW/CCW x  10 SLS 2 x 30 L - intermittent light support from single pole   11/11/23  THERAPEUTIC EXERCISE: To improve strength, endurance, ROM, and flexibility.  Demonstration, verbal and tactile cues throughout for technique.  S/L clams w/ YTB at distal thighs 2x10 B  Seated Resisted DF AROM w/ YTB 2x10 B  Seated Resisted PF AROM w/ YTB 2x10 B  Seated Resisted ankle eversion w/ YTB 2x10 B   NEURO RE-ED (corner balance) Static Stance x30  Static Stance with eyes closed x30- L ankle feels less steady  Staggered stance x30 B- more difficult w/LLE back  Tandem Stance x30 B- harder with LLE back  SLS x30 B- much more difficult on LLE, pain in front and lateral ankle   SELF CARE: Provided education for HEP review including recommended frequency for home performance.  Reviewed HEP w/ patient  Gastroc Stretch 2x30  Soleus Stretch 2x30  Ankle circles x10 ea direction Heel/Toe Raises x20    10/27/2023  SELF CARE:  Reviewed eval findings and role of PT in addressing identified deficits as well as instruction in initial HEP (see below).    PATIENT EDUCATION:  Education details: HEP review and desensitization techniques to reduce hypersensitivity  Person educated: Patient Education method: Explanation, Demonstration, Verbal cues, Handouts, and MedBridge[employer] app updated Education comprehension: verbalized understanding, returned demonstration, verbal cues required,  and needs further education  HOME EXERCISE PROGRAM: *Pt using MedBridge[employer] app.  Access Code: [email or web address] URL: [email or web address] Date: 11/25/2023 Prepared by: Elijah Hidden  Exercises - Gastroc Stretch on Wall (Mirrored)  - 2-3 x daily - 7 x weekly - 3 reps - 30 sec hold - Standing Soleus Stretch  - 2-3 x daily - 7 x weekly - 3 reps - 30 sec hold - Ankle Circles in Elevation  - 2-3 x daily - 7 x weekly - 2 sets - 10 reps - Seated Heel Toe Raises  - 2-3 x daily - 7 x weekly - 2 sets - 10 reps - 3 sec hold -  Clamshell with Resistance  - 1 x daily - 7 x weekly - 2 sets - 10 reps - Long Sitting Ankle Plantar Flexion with Resistance  - 1 x daily - 7 x weekly - 2 sets - 10 reps - 3 sec hold - Seated Ankle Dorsiflexion with Resistance  - 1 x daily - 7 x weekly - 2 sets - 10 reps - 3 sec hold - Seated Ankle Eversion with Resistance  - 1 x daily - 7 x weekly - 2 sets - 10 reps - 3 sec hold - Seated Ankle Inversion with Resistance  - 1 x daily - 7 x weekly - 2 sets - 10 reps - 3 sec hold - Hip Abduction with Resistance Loop  - 1 x daily - 7 x weekly - 2 sets - 10 reps - Hip Extension with Resistance Loop  - 1 x daily - 7 x weekly - 2 sets - 10 reps  Patient Education - Rubbing with Different Textures   ASSESSMENT:  CLINICAL IMPRESSION: Advanced work on functional strength on unsteady surfaces and reactionary balance. Pt showing more balance deficits with R external force and retro stepping on unsteady surfaces. CGA required for balance and cues needed to correct balance and form. [place] will benefit from continued skilled PT to address ongoing ROM, strength, proprioception and balance deficits to improve mobility and activity tolerance with decreased pain interference.   EVAL: Baker L Terrero is a 28 y.o. female who was referred to physical therapy for evaluation and treatment s/p correction hallux valgus left foot with metatarsus adductus correction and Akin osteotomy on 07/11/23.  Patient has been released from any postop restrictions and cleared to resume normal activity including running as tolerated.   Patient reports L foot ankle and calf pain persist since surgery.  Pain is worse with prolonged sitting or standing, or walking long distances.  Patient has deficits in L foot/ankle ROM, distal L LE flexibility, L>R LE strength, abnormal posture, and TTP with abnormal muscle tension which are interfering with ADLs and are impacting quality of life.  On LEFS patient scored 32/80 demonstrating moderate  functional limitation.  Janeshia will benefit from skilled PT to address above deficits to improve mobility and activity tolerance with decreased pain interference prior to proceeding with surgery for R foot.  OBJECTIVE IMPAIRMENTS: Abnormal gait, decreased activity tolerance, decreased balance, decreased endurance, decreased knowledge of condition, decreased mobility, difficulty walking, decreased ROM, decreased strength, hypomobility, increased edema, increased fascial restrictions, impaired perceived functional ability, increased muscle spasms, impaired flexibility, impaired sensation, improper body mechanics, postural dysfunction, pain, and hypermobility.   ACTIVITY LIMITATIONS: carrying, lifting, bending, sitting, standing, squatting, sleeping, stairs, transfers, bathing, locomotion level, and caring for others  PARTICIPATION LIMITATIONS: meal prep, cleaning, laundry, shopping, community activity, occupation, and yard work  PERSONAL FACTORS: Past/current experiences, Profession, Time since  onset of injury/illness/exacerbation, and 3+ comorbidities: anxiety, depression, asthma, Ehlers-Danlos/hypermobility syndrome, IBS are also affecting patient's functional outcome.   REHAB POTENTIAL: Good  CLINICAL DECISION MAKING: Evolving/moderate complexity  EVALUATION COMPLEXITY: Moderate   GOALS: Goals reviewed with patient? Yes  SHORT TERM GOALS: Target date: 12/01/2023  Patient will be independent with initial HEP. Baseline: Initial HEP provided on eval Goal status: MET - 11/11/23 - pt is doing HEP consistently   2.  Patient will report at least 25% improvement in L foot/ankle/calf pain to improve QOL. Baseline: 2/10 on eval, up to 8/10 Goal status: MET - 11/18/23  3.  Patient will improve 5x STS time to </= 15 seconds to demonstrate improved functional strength and transfer efficiency.  Baseline: 20.25 sec Goal status: MET - 11/25/23 - 13.25 sec   LONG TERM GOALS: Target date:  01/05/2024  Patient will be independent with advanced/ongoing HEP to improve outcomes and carryover.  Baseline:  Goal status: INITIAL  2.  Patient will report at least 50-75% improvement in L foot/ankle/calf pain to improve QOL. Baseline: 2/10 on eval, up to 8/10 Goal status: MET- 12/02/23   3.  Patient will demonstrate improved L ankle AROM to [facility] to allow for normal gait and stair mechanics. Baseline: Refer to above LE ROM table Goal status: MET- 12/17/23  4.  Patient will demonstrate improved B LE strength to >/= 4+/5 with exception of L PF to >/= 3+/5 for improved stability and ease of mobility. Baseline: Refer to above LE MMT table Goal status: 12/02/23 partially MET  5.  Patient will be able to ambulate ~1 mile with normal gait pattern without increased foot/ankle pain to access community.  Baseline: Antalgic gait pattern with gait distance limited to only a few blocks Goal status: INITIAL  6. Patient will be able to ascend/descend stairs with 1 HR and reciprocal step pattern safely to access home and community.  Baseline:  Goal status: PARTIALLY MET- 12/17/23 increased pain with stairs   7.  Patient will report >/= 45/80 on LEFS to demonstrate improved functional ability. Baseline: 32 / 80 = 40.0 % Goal status: INITIAL  8.  Patient will improve L SLS to >/= 20 sec to demonstrate improved L ankle stability. Baseline: 10.78 sec Goal status: IN PROGRESS - 11/13/23 - L SLS with 2 x 30 with intermittent light 1 pole    PLAN:  PT FREQUENCY: 2x/week  PT DURATION: 8-10 weeks   PLANNED INTERVENTIONS: 02835- PT Re-evaluation, 97750- Physical Performance Testing, 97110-Therapeutic exercises, 97530- Therapeutic activity, 97112- Neuromuscular re-education, 97535- Self Care, 02859- Manual therapy, [phone]- Gait training, [phone]- Aquatic Therapy, [phone]- Electrical stimulation (unattended), 97016- Vasopneumatic device, [id number]- Ultrasound, [id number]- Ionotophoresis 4mg /ml Dexamethasone , 79439 (1-2  muscles), 20561 (3+ muscles)- Dry Needling, Patient/Family education, Balance training, Stair training, Taping, Joint mobilization, Scar mobilization, DME instructions, Cryotherapy, and Moist heat  PLAN FOR NEXT SESSION: BOSU step ups, balance beam exercises, perturbation training with black TB   Shinika Estelle L Leo Fray, PTA 12/19/2023, 8:59 AM

## 2023-12-22 DIAGNOSIS — F411 Generalized anxiety disorder: Secondary | ICD-10-CM | POA: Diagnosis not present

## 2023-12-23 ENCOUNTER — Ambulatory Visit: Admitting: Physical Therapy

## 2023-12-23 ENCOUNTER — Encounter: Payer: Self-pay | Admitting: Physical Therapy

## 2023-12-23 DIAGNOSIS — M6281 Muscle weakness (generalized): Secondary | ICD-10-CM

## 2023-12-23 DIAGNOSIS — M25572 Pain in left ankle and joints of left foot: Secondary | ICD-10-CM | POA: Diagnosis not present

## 2023-12-23 DIAGNOSIS — R600 Localized edema: Secondary | ICD-10-CM

## 2023-12-23 DIAGNOSIS — R2689 Other abnormalities of gait and mobility: Secondary | ICD-10-CM

## 2023-12-23 NOTE — Therapy (Signed)
 OUTPATIENT PHYSICAL THERAPY LOWER EXTREMITY TREATMENT   Patient Name: Dana [name] MRN: [id number] DOB:08/06/1995, 28 y.o., female Today's Date: 12/23/2023   END OF SESSION:  PT End of Session - 12/23/23 0851     Visit Number 10    Date for Recertification  01/05/24    Authorization Type [employer] & [facility] Medicaid    PT Start Time [phone]    PT Stop Time 0933    PT Time Calculation (min) 42 min    Activity Tolerance Patient tolerated treatment well    Behavior During Therapy [facility] for tasks assessed/performed             Past Medical History:  Diagnosis Date   Anxiety    Anxiety disorder of adolescence 06/09/2015   Arm fracture    Asthma    Constipation    alternates with diarrhea   Depression    Diarrhea    alternates with constipation   Ehlers-Danlos syndrome    per patient   GERD (gastroesophageal reflux disease)    Inflammatory bowel disease    Insomnia 06/09/2015   Past Surgical History:  Procedure Laterality Date   FOOT ARTHRODESIS Left 07/11/2023   Procedure: FUSION, JOINT, FOOT;  Surgeon: Silva Juliene SAUNDERS, DPM;  Location: [facility];  Service: Podiatry;  Laterality: Left;  MID FOOT FUSION   HALLUX VALGUS AKIN Left 07/11/2023   Procedure: CORRECTION, HALLUX VALGUS, WITH AKIN OSTEOTOMY;  Surgeon: Silva Juliene SAUNDERS, DPM;  Location: [facility];  Service: Podiatry;  Laterality: Left;  GENERAL WITH BLOCK   HALLUX VALGUS LAPIDUS Left 07/11/2023   Procedure: ROMAYNE LOOK;  Surgeon: Silva Juliene SAUNDERS, DPM;  Location: [facility];  Service: Podiatry;  Laterality: Left;   NO PAST SURGERIES     Patient Active Problem List   Diagnosis Date Noted   Metatarsus primus varus of left foot 07/13/2023   Acquired hallux valgus of left foot 07/13/2023   Metatarsus adductus of left foot 07/13/2023   Major osseous defect 07/13/2023   Otalgia of both ears 03/06/2023   Body aches 03/06/2023   Hypermobility syndrome 02/18/2023   Suicidal ideation 12/19/2015   MDD (major  depressive disorder), recurrent episode, severe (HCC) 12/18/2015   Insomnia 06/09/2015   Anxiety disorder of adolescence 06/09/2015   Severe episode of recurrent major depressive disorder, without psychotic features (HCC) 06/02/2015   Alternating constipation and diarrhea    Nausea & vomiting 09/29/2011   IBS (irritable bowel syndrome) 06/28/2011   GERD (gastroesophageal reflux disease) 06/28/2011   Back pain 08/01/2010   DYSURIA 08/17/2009   ACUTE PHARYNGITIS 04/06/2009   ACUTE BRONCHITIS 04/06/2009   Acute gastritis 04/27/2007   Anxiety state 02/11/2007   Allergic rhinitis 02/11/2007   GERD 12/25/2006   LOOSE STOOLS 12/25/2006   RUQ PAIN 12/25/2006    PCP: Antonio Cyndee Jamee SAUNDERS, DO   REFERRING PROVIDER: Silva Juliene SAUNDERS, DPM   REFERRING DIAG:  M20.12,M21.612 (ICD-10-CM) - Hallux valgus with bunions, left  Q66.229 (ICD-10-CM) - Metatarsus adductus, congenital   Postop bunionectomy 3 months ago difficulty ambulating and swelling in ankle, work on joint manipulation range of motion strengthening and gait training. Evaluate and treat modalities as needed at your discretion twice weekly for 6 to 8 weeks.   THERAPY DIAG:  Pain in left ankle and joints of left foot  Muscle weakness (generalized)  Other abnormalities of gait and mobility  Localized edema  RATIONALE FOR EVALUATION AND TREATMENT: Rehabilitation  ONSET DATE: 07/11/2023 Surgery/procedure:  LOOK Bunionectomy Correction metatarsus adductus with  2nd and 3rd TMT fusion with osteotomy Akin phalangeal osteotomy  NEXT MD VISIT: 01/22/24   SUBJECTIVE:                                                                                                                                                                                                         SUBJECTIVE STATEMENT: Pt reports she has been doing yoga - she feels that this has been helping with strengthening her whole body as well as improving her balance.  She  feels like the desensitization techniques has been helping for the top of her L ankle and she is able to wear shoes now.  PAIN: Are you having pain? Yes: NPRS scale: 2/10  Pain location: dorsum of L ankle  Pain description: sharp, burning  Aggravating factors: prolonged sitting or standing, walking more than a couple blocks  Relieving factors: elevation, rest, ice   PERTINENT HISTORY:  anxiety, depression, asthma, Ehlers-Danlos/hypermobility syndrome, IBS  PRECAUTIONS: None - No restrictions on activity. Regular shoe gear as tolerated.   RED FLAGS: None  WEIGHT BEARING RESTRICTIONS: No  FALLS:  Has patient fallen in last 6 months? Yes. 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   OBJECTIVE: (objective measures completed at initial evaluation unless otherwise dated)  DIAGNOSTIC FINDINGS:  Not available  PATIENT SURVEYS:  LEFS: 32 / 80 = 40.0 %, moderate functional limitation  COGNITION: Overall cognitive status: Within functional limits for tasks assessed    SENSATION: Continued numbness and tingling in L foot, ankle and calf since surgery (slightly better in calf)  EDEMA:  Figure 8: R = 48.0 cm, L = 50.0 cm    POSTURE:  B pes planus in standing  PALPATION: Decreased incisional scar mobility, most notable on medial incision Decreased mid and hindfoot mobility Increased muscle tension/tightness in L gastroc/soleus, fibularis muscles, and anterior tibialis  MUSCLE LENGTH:  Heelcord: Mild tight L>R  LOWER  EXTREMITY ROM:  Active ROM Right eval Left eval L 11/21/23 L 12/17/23  Ankle dorsiflexion 12 1 10 15   Ankle plantarflexion 71 49 50 60  Ankle inversion 40 27 38 36  Ankle eversion 25 12 28 30    Passive ROM Left eval  Ankle dorsiflexion 6  Ankle plantarflexion 50  Ankle inversion 34  Ankle eversion 14  (Blank rows = not tested)  LOWER EXTREMITY MMT:  MMT Right eval Left eval R 12/02/23 L 12/02/23  Hip flexion 5 4- 5 5  Hip extension 4+ 3+ 4+ 4+  Hip abduction 4+ 4- 4+ 4+  Hip adduction 4+ 4+ 4+ 4  Hip internal rotation 4+ 4- 4+ 4+  Hip external rotation 4- 3+ 4+ 4+  Knee flexion 5 5    Knee extension 5 4+    Ankle dorsiflexion 5 3+ 5 4+  Ankle plantarflexion 4- (8 SLS HR) 2 17 heel raises 13 Heel raises  Ankle inversion 5 3 5  4+  Ankle eversion 5 3- 5 4   (Blank rows = not tested)  FUNCTIONAL TESTS:  5 times sit to stand: 20.25 sec  SLS: R = 37.66 sec, L = 10.78 sec  GAIT: Distance walked: Clinic distances Assistive device utilized: None Level of assistance: Complete Independence Gait pattern: decreased step length- Right, decreased stance time- Left, decreased ankle dorsiflexion- Left, antalgic, and lateral lean- Right   TODAY'S TREATMENT:   12/23/23 THERAPEUTIC EXERCISE: To improve strength and endurance.   NuStep - L6 x 6 min (UE/LE)  THERAPEUTIC ACTIVITIES: To improve functional performance.  Demonstration, verbal and tactile cues throughout for technique. SLS: R = 37.75 sec, L = 37.66 sec w/o UE support  MANUAL THERAPY: To promote normalized muscle tension, improved flexibility, improved joint mobility, increased ROM, and reduced pain utilizing joint mobilization, connective tissue massage, therapeutic massage, myofascial release, and scar mobilization. L ankle talocrural, intertarsal, and tarsal-metatarsal mobilization STM/DTM and XFM to dorsum of L ankle and distal anterior tibialis  NEUROMUSCULAR RE-EDUCATION: To improve balance, coordination, kinesthesia,  posture, proprioception, and reduce fall risk. SLS + GTB 4-way SLR x 10 bil, rare/occasional UE support on back of chair for balance   12/19/23 NEUROMUSCULAR RE-EDUCATION: To improve coordination, kinesthesia, posture, and proprioception.  Step ups BOSU LLE x 20 Lateral step ups BOSU LLE x 20 Resisted gait black TB with perturbations side to side 2x150'; additional 150 ft with occasional reactionary balance Fwd/retro gait walking on unsteady surfaces 10x each way- some LOB with retro stepping [place] on unsteady surface 3x each way not as hard  THERAPEUTIC EXERCISE: To improve strength, endurance, ROM, and flexibility.  Nustep L5x81min Seated ankle EV and IV x 20 GTB Calf raise from 2' book x 20   12/17/23 NEUROMUSCULAR RE-EDUCATION: To improve coordination, kinesthesia, posture, and proprioception.  Step ups BOSU LLE x 10 Lateral step ups BOSU LLE x 10 Standing balance on BOSU BLE 2x30  THERAPEUTIC EXERCISE: To improve strength, endurance, ROM, and flexibility.  Nustep L6x21min Assessment of stairs: [facility], patient reports slight increase in pain w/ stairs Toe curls with towel on floor x 20 Standing gastroc and soleus stretch x 30' each with blue band dorsal glide Standing calf raises from 2' book    12/02/23 THERAPEUTIC EXERCISE: To improve strength, endurance, ROM, and flexibility.  Demonstration, verbal and tactile cues throughout for technique.  NuStep - L5 x 7 min (UE/LE) Tested strength Seated self-anchored RTB ankle eversion x 20 Seated self-anchored RTB ankle inversion x 20  THERAPEUTIC ACTIVITIES: To improve stair climbing ability  Step ups LLE 8' 2 x 10  Lateral step ups LLE 8' 2 x 10  NEUROMUSCULAR RE-EDUCATION: To improve balance and proprioception Tandem walk on balance beam fwd  and back 3x Sidestep with EC on balance beam 3x both ways   11/25/23 THERAPEUTIC EXERCISE: To improve strength, endurance, ROM, and flexibility.  Demonstration, verbal and tactile cues  throughout for technique.  NuStep - L6 x 6 min (UE/LE) Seated self-anchored RTB ankle eversion 2 x 10 Seated self-anchored RTB ankle DF 2 x 10 Seated self-anchored RTB ankle inversion 2 x 10 Seated self-anchored blue TB ankle PF 2 x 10 L ankle RTB toe curls 2 x 10  SELF CARE:  Provided education on post-op incisional scar tissue mobilization and desensitization techniques rubbing with different textures .   THERAPEUTIC ACTIVITIES: To improve functional performance.  Demonstration, verbal and tactile cues throughout for technique. 5xSTS = 13.25 sec  NEUROMUSCULAR RE-EDUCATION: To improve coordination, kinesthesia, proprioception, amplitude of movement, and reduce rigidity. L ankle PNF D1 & D2 diagonals 3 x 10 - 1 set w/o resistance, 2nd set resisting DF diagonals, 3rd set resisting PF diagonals Bridge + RTB B hip ABD/ER clam (maintaining level pelvis) 2 x 10 Bridge + RTB march x 10   11/21/23 THERAPEUTIC EXERCISE: To improve strength and endurance.  Demonstration, verbal and tactile cues throughout for technique.  NuStep - L6 x 6 min (UE/LE) Seated self-anchored RTB ankle eversion 2 x 10 Seated self-anchored RTB ankle DF 2 x 10 Seated self-anchored RTB ankle inversion 2 x 10 Clamshell RTB x 20 R/L in sidelying  NEUROMUSCULAR RE-EDUCATION: To improve balance, coordination, kinesthesia, proprioception, and reduce fall risk. Standing hip abduction RTB at ankles x 10 B Standing hip extension RTB at ankles x 10 B Standing hip ABD to ext toe taps RTB at ankles x 10 B Sidestep 2x along counter RTB at ankles   11/18/23 THERAPEUTIC EXERCISE: To improve strength and endurance.  Demonstration, verbal and tactile cues throughout for technique.  NuStep - L6 x 6 min (UE/LE) Seated self-anchored YTB ankle eversion 2 x 10 Seated self-anchored YTB ankle DF 2 x 10 Seated self-anchored YTB ankle inversion 2 x 10 Seated self-anchored GTB ankle PF 2 x 10  NEUROMUSCULAR RE-EDUCATION: To improve  balance, coordination, kinesthesia, proprioception, and reduce fall risk. Seated BAPS - L3 DF/PF 2 x 10 (keeping sides level) 2.5 weights on lateral, then medial side x 10 each IV/EV 2 x 10 - more difficult CW/CCW x 20 SLS 2 x 30 L - no UE support Standing L DF/knee flexion mobilization on slant board 5x3   11/13/23 THERAPEUTIC EXERCISE: To improve strength and endurance.  Demonstration, verbal and tactile cues throughout for technique.  NuStep - L6 x 6 min (UE/LE) Seated self-anchored YTB ankle eversion 2 x 10 Seated self-anchored YTB ankle DF 2 x 10 Seated self-anchored YTB ankle inversion 2 x 10 Seated self-anchored GTB ankle PF 2 x 10  MANUAL THERAPY: To promote improved flexibility, improved joint mobility, increased ROM, and reduced pain utilizing joint mobilization.  L ankle subtalar and tarsal-metatarsal joint mobs - grade II-III  NEUROMUSCULAR RE-EDUCATION: To improve balance, coordination, kinesthesia, proprioception, and reduce fall risk. Seated BAPS - L3 DF/PF 2 x 10 (keeping sides level) IV/EV 2 x 10 (keeping front/back level) - more difficult CW/CCW x 10 SLS 2 x 30 L - intermittent light support from single pole   11/11/23  THERAPEUTIC EXERCISE: To improve strength, endurance, ROM, and flexibility.  Demonstration, verbal and tactile cues throughout for technique.  S/L clams w/ YTB at distal thighs 2x10 B  Seated Resisted DF AROM w/ YTB 2x10 B  Seated Resisted PF AROM w/ YTB 2x10 B  Seated Resisted ankle eversion w/ YTB 2x10 B   NEURO RE-ED (corner balance) Static Stance x30  Static Stance with eyes closed x30- L ankle feels less steady  Staggered stance x30 B- more difficult w/LLE back  Tandem Stance x30 B- harder with LLE back  SLS x30 B- much more difficult on LLE, pain in front and lateral ankle   SELF CARE: Provided education for HEP review including recommended frequency for home performance.  Reviewed HEP w/ patient  Gastroc Stretch 2x30  Soleus  Stretch 2x30  Ankle circles x10 ea direction Heel/Toe Raises x20    10/27/2023  SELF CARE:  Reviewed eval findings and role of PT in addressing identified deficits as well as instruction in initial HEP (see below).    PATIENT EDUCATION:  Education details: continue with current HEP  Person educated: Patient Education method: Explanation Education comprehension: verbalized understanding  HOME EXERCISE PROGRAM: *Pt using [email or web address] app.  Access Code: [email or web address] URL: [email or web address] Date: 11/25/2023 Prepared by: Elijah Hidden  Exercises - Gastroc Stretch on Wall (Mirrored)  - 2-3 x daily - 7 x weekly - 3 reps - 30 sec hold - Standing Soleus Stretch  - 2-3 x daily - 7 x weekly - 3 reps - 30 sec hold - Ankle Circles in Elevation  - 2-3 x daily - 7 x weekly - 2 sets - 10 reps - Seated Heel Toe Raises  - 2-3 x daily - 7 x weekly - 2 sets - 10 reps - 3 sec hold - Clamshell with Resistance  - 1 x daily - 7 x weekly - 2 sets - 10 reps - Long Sitting Ankle Plantar Flexion with Resistance  - 1 x daily - 7 x weekly - 2 sets - 10 reps - 3 sec hold - Seated Ankle Dorsiflexion with Resistance  - 1 x daily - 7 x weekly - 2 sets - 10 reps - 3 sec hold - Seated Ankle Eversion with Resistance  - 1 x daily - 7 x weekly - 2 sets - 10 reps - 3 sec hold - Seated Ankle Inversion with Resistance  - 1 x daily - 7 x weekly - 2 sets - 10 reps - 3 sec hold - Hip Abduction with Resistance Loop  - 1 x daily - 7 x weekly - 2 sets - 10 reps - Hip Extension with Resistance Loop  - 1 x daily - 7 x weekly - 2 sets - 10 reps  Patient Education - Rubbing with Different Textures   ASSESSMENT:  CLINICAL IMPRESSION: Sherral reports that the desensitization techniques have been helping with improving tolerance for wearing shoes on her L foot, however she continues to have pain and stiffness on the dorsum of her foot and ankle.  MT focused on improving joint mobility at at the talocrural joint as  well as intertarsal and tarsometatarsal joints to decrease pain and increase mobility as well as allow for more normal gait pattern with patient noting good relief following MT.  SLS balance improving therefore progressed proximal LE strengthening to SLS + 4-way SLR with GTB to continue to promote proximal stability as well as ankle proprioception and control.  Patient able to perform most motions with only occasional need for UE support for balance.  Halayna continues to demonstrate good progress towards her PT goals and will benefit from continued skilled PT to address ongoing ROM, strength, proprioception and balance deficits to improve mobility and activity tolerance with decreased pain interference.   EVAL: Fadia L  Steines is a 28 y.o. female who was referred to physical therapy for evaluation and treatment s/p correction hallux valgus left foot with metatarsus adductus correction and Akin osteotomy on 07/11/23.  Patient has been released from any postop restrictions and cleared to resume normal activity including running as tolerated.   Patient reports L foot ankle and calf pain persist since surgery.  Pain is worse with prolonged sitting or standing, or walking long distances.  Patient has deficits in L foot/ankle ROM, distal L LE flexibility, L>R LE strength, abnormal posture, and TTP with abnormal muscle tension which are interfering with ADLs and are impacting quality of life.  On LEFS patient scored 32/80 demonstrating moderate functional limitation.  Pami will benefit from skilled PT to address above deficits to improve mobility and activity tolerance with decreased pain interference prior to proceeding with surgery for R foot.  OBJECTIVE IMPAIRMENTS: Abnormal gait, decreased activity tolerance, decreased balance, decreased endurance, decreased knowledge of condition, decreased mobility, difficulty walking, decreased ROM, decreased strength, hypomobility, increased edema, increased fascial  restrictions, impaired perceived functional ability, increased muscle spasms, impaired flexibility, impaired sensation, improper body mechanics, postural dysfunction, pain, and hypermobility.   ACTIVITY LIMITATIONS: carrying, lifting, bending, sitting, standing, squatting, sleeping, stairs, transfers, bathing, locomotion level, and caring for others  PARTICIPATION LIMITATIONS: meal prep, cleaning, laundry, shopping, community activity, occupation, and yard work  PERSONAL FACTORS: Past/current experiences, Profession, Time since onset of injury/illness/exacerbation, and 3+ comorbidities: anxiety, depression, asthma, Ehlers-Danlos/hypermobility syndrome, IBS are also affecting patient's functional outcome.   REHAB POTENTIAL: Good  CLINICAL DECISION MAKING: Evolving/moderate complexity  EVALUATION COMPLEXITY: Moderate   GOALS: Goals reviewed with patient? Yes  SHORT TERM GOALS: Target date: 12/01/2023  Patient will be independent with initial HEP. Baseline: Initial HEP provided on eval Goal status: MET - 11/11/23 - pt is doing HEP consistently   2.  Patient will report at least 25% improvement in L foot/ankle/calf pain to improve QOL. Baseline: 2/10 on eval, up to 8/10 Goal status: MET - 11/18/23  3.  Patient will improve 5x STS time to </= 15 seconds to demonstrate improved functional strength and transfer efficiency.  Baseline: 20.25 sec Goal status: MET - 11/25/23 - 13.25 sec   LONG TERM GOALS: Target date: 01/05/2024  Patient will be independent with advanced/ongoing HEP to improve outcomes and carryover.  Baseline:  Goal status: IN PROGRESS - 12/23/23 - HEP helping with pain and stiffness  2.  Patient will report at least 50-75% improvement in L foot/ankle/calf pain to improve QOL. Baseline: 2/10 on eval, up to 8/10 Goal status: MET - 12/02/23   3.  Patient will demonstrate improved L ankle AROM to [facility] to allow for normal gait and stair mechanics. Baseline: Refer to above LE ROM  table Goal status: MET - 12/17/23  4.  Patient will demonstrate improved B LE strength to >/= 4+/5 with exception of L PF to >/= 3+/5 for improved stability and ease of mobility. Baseline: Refer to above LE MMT table Goal status: PARTIALLY MET - 12/02/23  5.  Patient will be able to ambulate ~1 mile with normal gait pattern without increased foot/ankle pain to access community.  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 Patient will be able to ascend/descend stairs with  1 HR and reciprocal step pattern safely to access home and community.  Baseline:  Goal status: PARTIALLY MET - 12/17/23 - increased pain with stairs   7.  Patient will report >/= 45/80 on LEFS to demonstrate improved functional ability. Baseline: 32 / 80 = 40.0 % Goal status: INITIAL  8.  Patient will improve L SLS to >/= 20 sec to demonstrate improved L ankle stability. Baseline: 10.78 sec 11/13/23 - L SLS with 2 x 30 with intermittent light 1 pole  Goal status: MET - 12/23/23 - R = 37.75 sec, L = 37.66 sec   PLAN:  PT FREQUENCY: 2x/week  PT DURATION: 8-10 weeks   PLANNED INTERVENTIONS: 02835- PT Re-evaluation, 97750- Physical Performance Testing, 97110-Therapeutic exercises, 97530- Therapeutic activity, 97112- Neuromuscular re-education, 97535- Self Care, 02859- Manual therapy, [id number]- Gait training, [phone]- Aquatic Therapy, [id number]- Electrical stimulation (unattended), [id number]- Vasopneumatic device, [id number]- Ultrasound, [id number]- Ionotophoresis 4mg /ml Dexamethasone , [id number] (1-2 muscles), [id number] (3+ muscles)- Dry Needling, Patient/Family education, Balance training, Stair training, Taping, Joint mobilization, Scar mobilization, DME instructions, Cryotherapy, and Moist heat  PLAN FOR NEXT SESSION: BOSU step ups,  balance beam exercises, perturbation training with black TB   Elijah CHRISTELLA Hidden, PT 12/23/2023, 7:28 PM

## 2023-12-26 ENCOUNTER — Ambulatory Visit: Attending: Podiatry

## 2023-12-26 DIAGNOSIS — M25572 Pain in left ankle and joints of left foot: Secondary | ICD-10-CM | POA: Diagnosis not present

## 2023-12-26 DIAGNOSIS — M6281 Muscle weakness (generalized): Secondary | ICD-10-CM | POA: Insufficient documentation

## 2023-12-26 DIAGNOSIS — R600 Localized edema: Secondary | ICD-10-CM | POA: Insufficient documentation

## 2023-12-26 DIAGNOSIS — R2689 Other abnormalities of gait and mobility: Secondary | ICD-10-CM | POA: Insufficient documentation

## 2023-12-26 NOTE — Therapy (Signed)
 OUTPATIENT PHYSICAL THERAPY LOWER EXTREMITY TREATMENT   Patient Name: FATIMA FEDIE MRN: [id number] DOB:05/13/1995, 28 y.o., female Today's Date: 12/26/2023   END OF SESSION:  PT End of Session - 12/26/23 0811     Visit Number 11    Date for Recertification  01/05/24    Authorization Type BCBS & [facility] Medicaid    PT Start Time 0804    PT Stop Time 0847    PT Time Calculation (min) 43 min    Activity Tolerance Patient tolerated treatment well    Behavior During Therapy [facility] for tasks assessed/performed              Past Medical History:  Diagnosis Date   Anxiety    Anxiety disorder of adolescence 06/09/2015   Arm fracture    Asthma    Constipation    alternates with diarrhea   Depression    Diarrhea    alternates with constipation   Ehlers-Danlos syndrome    per patient   GERD (gastroesophageal reflux disease)    Inflammatory bowel disease    Insomnia 06/09/2015   Past Surgical History:  Procedure Laterality Date   FOOT ARTHRODESIS Left 07/11/2023   Procedure: FUSION, JOINT, FOOT;  Surgeon: Silva Juliene SAUNDERS, DPM;  Location: [facility] ORS;  Service: Podiatry;  Laterality: Left;  MID FOOT FUSION   HALLUX VALGUS AKIN Left 07/11/2023   Procedure: CORRECTION, HALLUX VALGUS, WITH AKIN OSTEOTOMY;  Surgeon: Silva Juliene SAUNDERS, DPM;  Location: [facility] OR[facility];  Service: Podiatry;  Laterality: Left;  GENERAL WITH BLOCK   HALLUX VALGUS LAPIDUS Left 07/11/2023   Procedure: ROMAYNE LOOK;  Surgeon: Silva Juliene SAUNDERS, DPM;  Location: [facility] OR[facility];  Service: Podiatry;  Laterality: Left;   NO PAST SURGERIES     Patient Active Problem List   Diagnosis Date Noted   Metatarsus primus varus of left foot 07/13/2023   Acquired hallux valgus of left foot 07/13/2023   Metatarsus adductus of left foot 07/13/2023   Major osseous defect 07/13/2023   Otalgia of both ears 03/06/2023   Body aches 03/06/2023   Hypermobility syndrome 02/18/2023   Suicidal ideation 12/19/2015   MDD (major  depressive disorder), recurrent episode, severe (HCC) 12/18/2015   Insomnia 06/09/2015   Anxiety disorder of adolescence 06/09/2015   Severe episode of recurrent major depressive disorder, without psychotic features (HCC) 06/02/2015   Alternating constipation and diarrhea    Nausea & vomiting 09/29/2011   IBS (irritable bowel syndrome) 06/28/2011   GERD (gastroesophageal reflux disease) 06/28/2011   Back pain 08/01/2010   DYSURIA 08/17/2009   ACUTE PHARYNGITIS 04/06/2009   ACUTE BRONCHITIS 04/06/2009   Acute gastritis 04/27/2007   Anxiety state 02/11/2007   Allergic rhinitis 02/11/2007   GERD 12/25/2006   LOOSE STOOLS 12/25/2006   RUQ PAIN 12/25/2006    PCP: Antonio Cyndee Jamee SAUNDERS, DO   REFERRING PROVIDER: Silva Juliene SAUNDERS, DPM   REFERRING DIAG:  M20.12,M21.612 (ICD-10-CM) - Hallux valgus with bunions, left  Q66.229 (ICD-10-CM) - Metatarsus adductus, congenital   Postop bunionectomy 3 months ago difficulty ambulating and swelling in ankle, work on joint manipulation range of motion strengthening and gait training. Evaluate and treat modalities as needed at your discretion twice weekly for 6 to 8 weeks.   THERAPY DIAG:  Pain in left ankle and joints of left foot  Muscle weakness (generalized)  Other abnormalities of gait and mobility  Localized edema  RATIONALE FOR EVALUATION AND TREATMENT: Rehabilitation  ONSET DATE: 07/11/2023 Surgery/procedure:  LOOK Bunionectomy Correction metatarsus adductus  with 2nd and 3rd TMT fusion with osteotomy Akin phalangeal osteotomy  NEXT MD VISIT: 01/22/24   SUBJECTIVE:                                                                                                                                                                                                         SUBJECTIVE STATEMENT: Just an ache. 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 Yes: NPRS scale: 1/10  Pain location: dorsum of L ankle  Pain  description: sharp, burning  Aggravating factors: prolonged sitting or standing, walking more than a couple blocks  Relieving factors: elevation, rest, ice   PERTINENT HISTORY:  anxiety, depression, asthma, Ehlers-Danlos/hypermobility syndrome, IBS  PRECAUTIONS: None - No restrictions on activity. Regular shoe gear as tolerated.   RED FLAGS: None  WEIGHT BEARING RESTRICTIONS: No  FALLS:  Has patient fallen in last 6 months? Yes. 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   OBJECTIVE: (objective measures completed at initial evaluation unless otherwise dated)  DIAGNOSTIC FINDINGS:  Not available  PATIENT SURVEYS:  LEFS: 32 / 80 = 40.0 %, moderate functional limitation  COGNITION: Overall cognitive status: Within functional limits for tasks assessed    SENSATION: Continued numbness and tingling in L foot, ankle and calf since surgery (slightly better in calf)  EDEMA:  Figure 8: R = 48.0 cm, L = 50.0 cm    POSTURE:  B pes planus in standing  PALPATION: Decreased incisional scar mobility, most notable on medial incision Decreased mid and hindfoot mobility Increased muscle tension/tightness in L gastroc/soleus, fibularis muscles, and anterior tibialis  MUSCLE LENGTH:  Heelcord: Mild tight L>R  LOWER EXTREMITY ROM:  Active ROM Right eval Left eval L 11/21/23 L 12/17/23  Ankle dorsiflexion 12 1 10 15   Ankle plantarflexion 71 49 50 60  Ankle inversion 40 27 38 36  Ankle eversion 25 12 28 30    Passive ROM Left eval   Ankle dorsiflexion 6  Ankle plantarflexion 50  Ankle inversion 34  Ankle eversion 14  (Blank rows = not tested)  LOWER EXTREMITY MMT:  MMT Right  eval Left eval R 12/02/23 L 12/02/23  Hip flexion 5 4- 5 5  Hip extension 4+ 3+ 4+ 4+  Hip abduction 4+ 4- 4+ 4+  Hip adduction 4+ 4+ 4+ 4  Hip internal rotation 4+ 4- 4+ 4+  Hip external rotation 4- 3+ 4+ 4+  Knee flexion 5 5    Knee extension 5 4+    Ankle dorsiflexion 5 3+ 5 4+  Ankle plantarflexion 4- (8 SLS HR) 2 17 heel raises 13 Heel raises  Ankle inversion 5 3 5  4+  Ankle eversion 5 3- 5 4   (Blank rows = not tested)  FUNCTIONAL TESTS:  5 times sit to stand: 20.25 sec  SLS: R = 37.66 sec, L = 10.78 sec  GAIT: Distance walked: Clinic distances Assistive device utilized: None Level of assistance: Complete Independence Gait pattern: decreased step length- Right, decreased stance time- Left, decreased ankle dorsiflexion- Left, antalgic, and lateral lean- Right   TODAY'S TREATMENT:  12/26/23 Bike L6x38min  Resisted walking Black TB with lateral perturbations 125ft 3x Step downs 8' leading RLE 2x10 Squats on upside down BOSU x 10 SLS with shoulder ext RTB 2x10 SLS with lateral oscillations  x 10- harder L SLS on BOSU ball 2x10- intermittent UE support  12/23/23 THERAPEUTIC EXERCISE: To improve strength and endurance.   NuStep - L6 x 6 min (UE/LE)  THERAPEUTIC ACTIVITIES: To improve functional performance.  Demonstration, verbal and tactile cues throughout for technique. SLS: R = 37.75 sec, L = 37.66 sec w/o UE support  MANUAL THERAPY: To promote normalized muscle tension, improved flexibility, improved joint mobility, increased ROM, and reduced pain utilizing joint mobilization, connective tissue massage, therapeutic massage, myofascial release, and scar mobilization. L ankle talocrural, intertarsal, and tarsal-metatarsal mobilization STM/DTM and XFM to dorsum of L ankle and distal anterior tibialis  NEUROMUSCULAR  RE-EDUCATION: To improve balance, coordination, kinesthesia, posture, proprioception, and reduce fall risk. SLS + GTB 4-way SLR x 10 bil, rare/occasional UE support on back of chair for balance   12/19/23 NEUROMUSCULAR RE-EDUCATION: To improve coordination, kinesthesia, posture, and proprioception.  Step ups BOSU LLE x 20 Lateral step ups BOSU LLE x 20 Resisted gait black TB with perturbations side to side 2x150'; additional 150 ft with occasional reactionary balance Fwd/retro gait walking on unsteady surfaces 10x each way- some LOB with retro stepping [place] on unsteady surface 3x each way not as hard  THERAPEUTIC EXERCISE: To improve strength, endurance, ROM, and flexibility.  Nustep L5x48min Seated ankle EV and IV x 20 GTB Calf raise from 2' book x 20   12/17/23 NEUROMUSCULAR RE-EDUCATION: To improve coordination, kinesthesia, posture, and proprioception.  Step ups BOSU LLE x 10 Lateral step ups BOSU LLE x 10 Standing balance on BOSU BLE 2x30  THERAPEUTIC EXERCISE: To improve strength, endurance, ROM, and flexibility.  Nustep L6x37min Assessment of stairs: [facility], patient reports slight increase in pain w/ stairs Toe curls with towel on floor x 20 Standing gastroc and soleus stretch x 30' each with blue band dorsal glide Standing calf raises from 2' book    12/02/23 THERAPEUTIC EXERCISE: To improve strength, endurance, ROM, and flexibility.  Demonstration, verbal and tactile cues throughout for technique.  NuStep - L5 x 7 min (UE/LE) Tested strength Seated self-anchored RTB ankle eversion x 20 Seated self-anchored RTB ankle inversion x 20  THERAPEUTIC ACTIVITIES: To improve stair climbing ability  Step ups LLE 8' 2 x 10  Lateral step ups LLE 8' 2 x 10  NEUROMUSCULAR RE-EDUCATION: To improve balance  and proprioception Tandem walk on balance beam fwd and back 3x Sidestep with EC on balance beam 3x both ways   11/25/23 THERAPEUTIC EXERCISE: To improve strength, endurance, ROM,  and flexibility.  Demonstration, verbal and tactile cues throughout for technique.  NuStep - L6 x 6 min (UE/LE) Seated self-anchored RTB ankle eversion 2 x 10 Seated self-anchored RTB ankle DF 2 x 10 Seated self-anchored RTB ankle inversion 2 x 10 Seated self-anchored blue TB ankle PF 2 x 10 L ankle RTB toe curls 2 x 10  SELF CARE:  Provided education on post-op incisional scar tissue mobilization and desensitization techniques rubbing with different textures .   THERAPEUTIC ACTIVITIES: To improve functional performance.  Demonstration, verbal and tactile cues throughout for technique. 5xSTS = 13.25 sec  NEUROMUSCULAR RE-EDUCATION: To improve coordination, kinesthesia, proprioception, amplitude of movement, and reduce rigidity. L ankle PNF D1 & D2 diagonals 3 x 10 - 1 set w/o resistance, 2nd set resisting DF diagonals, 3rd set resisting PF diagonals Bridge + RTB B hip ABD/ER clam (maintaining level pelvis) 2 x 10 Bridge + RTB march x 10   11/21/23 THERAPEUTIC EXERCISE: To improve strength and endurance.  Demonstration, verbal and tactile cues throughout for technique.  NuStep - L6 x 6 min (UE/LE) Seated self-anchored RTB ankle eversion 2 x 10 Seated self-anchored RTB ankle DF 2 x 10 Seated self-anchored RTB ankle inversion 2 x 10 Clamshell RTB x 20 R/L in sidelying  NEUROMUSCULAR RE-EDUCATION: To improve balance, coordination, kinesthesia, proprioception, and reduce fall risk. Standing hip abduction RTB at ankles x 10 B Standing hip extension RTB at ankles x 10 B Standing hip ABD to ext toe taps RTB at ankles x 10 B Sidestep 2x along counter RTB at ankles   11/18/23 THERAPEUTIC EXERCISE: To improve strength and endurance.  Demonstration, verbal and tactile cues throughout for technique.  NuStep - L6 x 6 min (UE/LE) Seated self-anchored YTB ankle eversion 2 x 10 Seated self-anchored YTB ankle DF 2 x 10 Seated self-anchored YTB ankle inversion 2 x 10 Seated self-anchored GTB  ankle PF 2 x 10  NEUROMUSCULAR RE-EDUCATION: To improve balance, coordination, kinesthesia, proprioception, and reduce fall risk. Seated BAPS - L3 DF/PF 2 x 10 (keeping sides level) 2.5 weights on lateral, then medial side x 10 each IV/EV 2 x 10 - more difficult CW/CCW x 20 SLS 2 x 30 L - no UE support Standing L DF/knee flexion mobilization on slant board 5x3   11/13/23 THERAPEUTIC EXERCISE: To improve strength and endurance.  Demonstration, verbal and tactile cues throughout for technique.  NuStep - L6 x 6 min (UE/LE) Seated self-anchored YTB ankle eversion 2 x 10 Seated self-anchored YTB ankle DF 2 x 10 Seated self-anchored YTB ankle inversion 2 x 10 Seated self-anchored GTB ankle PF 2 x 10  MANUAL THERAPY: To promote improved flexibility, improved joint mobility, increased ROM, and reduced pain utilizing joint mobilization.  L ankle subtalar and tarsal-metatarsal joint mobs - grade II-III  NEUROMUSCULAR RE-EDUCATION: To improve balance, coordination, kinesthesia, proprioception, and reduce fall risk. Seated BAPS - L3 DF/PF 2 x 10 (keeping sides level) IV/EV 2 x 10 (keeping front/back level) - more difficult CW/CCW x 10 SLS 2 x 30 L - intermittent light support from single pole   11/11/23  THERAPEUTIC EXERCISE: To improve strength, endurance, ROM, and flexibility.  Demonstration, verbal and tactile cues throughout for technique.  S/L clams w/ YTB at distal thighs 2x10 B  Seated Resisted DF AROM w/ YTB 2x10 B  Seated Resisted PF AROM w/ YTB 2x10 B  Seated Resisted ankle eversion w/ YTB 2x10 B   NEURO RE-ED (corner balance) Static Stance x30  Static Stance with eyes closed x30- L ankle feels less steady  Staggered stance x30 B- more difficult w/LLE back  Tandem Stance x30 B- harder with LLE back  SLS x30 B- much more difficult on LLE, pain in front and lateral ankle   SELF CARE: Provided education for HEP review including recommended frequency for home performance.   Reviewed HEP w/ patient  Gastroc Stretch 2x30  Soleus Stretch 2x30  Ankle circles x10 ea direction Heel/Toe Raises x20    10/27/2023  SELF CARE:  Reviewed eval findings and role of PT in addressing identified deficits as well as instruction in initial HEP (see below).    PATIENT EDUCATION:  Education details: continue with current HEP  Person educated: Patient Education method: Explanation Education comprehension: verbalized understanding  HOME EXERCISE PROGRAM: *Pt using [email or web address] app.  Access Code: [email or web address] URL: [email or web address] Date: 11/25/2023 Prepared by: Elijah Hidden  Exercises - Gastroc Stretch on Wall (Mirrored)  - 2-3 x daily - 7 x weekly - 3 reps - 30 sec hold - Standing Soleus Stretch  - 2-3 x daily - 7 x weekly - 3 reps - 30 sec hold - Ankle Circles in Elevation  - 2-3 x daily - 7 x weekly - 2 sets - 10 reps - Seated Heel Toe Raises  - 2-3 x daily - 7 x weekly - 2 sets - 10 reps - 3 sec hold - Clamshell with Resistance  - 1 x daily - 7 x weekly - 2 sets - 10 reps - Long Sitting Ankle Plantar Flexion with Resistance  - 1 x daily - 7 x weekly - 2 sets - 10 reps - 3 sec hold - Seated Ankle Dorsiflexion with Resistance  - 1 x daily - 7 x weekly - 2 sets - 10 reps - 3 sec hold - Seated Ankle Eversion with Resistance  - 1 x daily - 7 x weekly - 2 sets - 10 reps - 3 sec hold - Seated Ankle Inversion with Resistance  - 1 x daily - 7 x weekly - 2 sets - 10 reps - 3 sec hold - Hip Abduction with Resistance Loop  - 1 x daily - 7 x weekly - 2 sets - 10 reps - Hip Extension with Resistance Loop  - 1 x daily - 7 x weekly - 2 sets - 10 reps  Patient Education - Rubbing with Different Textures   ASSESSMENT:  CLINICAL IMPRESSION: Advanced work on functional strength on unsteady surfaces and reactionary balance. She is showing more stability with lateral perturbations today. Progressed SLS with external force with no issues. Cuing given throughout  session. Chealsea continues to demonstrate good progress towards her PT goals and will benefit from continued skilled PT to address ongoing ROM, strength, proprioception and balance deficits to improve mobility and activity tolerance with decreased pain interference.   EVAL: Fatisha L Murnane is a 28 y.o. female who was referred to physical therapy for evaluation and treatment s/p correction hallux valgus left foot with metatarsus adductus correction and Akin osteotomy on 07/11/23.  Patient has been released from any postop restrictions and cleared to resume normal activity including running as tolerated.   Patient reports L foot ankle and calf pain persist since surgery.  Pain is worse with prolonged sitting or standing, or walking long distances.  Patient has deficits in  L foot/ankle ROM, distal L LE flexibility, L>R LE strength, abnormal posture, and TTP with abnormal muscle tension which are interfering with ADLs and are impacting quality of life.  On LEFS patient scored 32/80 demonstrating moderate functional limitation.  Loranda will benefit from skilled PT to address above deficits to improve mobility and activity tolerance with decreased pain interference prior to proceeding with surgery for R foot.  OBJECTIVE IMPAIRMENTS: Abnormal gait, decreased activity tolerance, decreased balance, decreased endurance, decreased knowledge of condition, decreased mobility, difficulty walking, decreased ROM, decreased strength, hypomobility, increased edema, increased fascial restrictions, impaired perceived functional ability, increased muscle spasms, impaired flexibility, impaired sensation, improper body mechanics, postural dysfunction, pain, and hypermobility.   ACTIVITY LIMITATIONS: carrying, lifting, bending, sitting, standing, squatting, sleeping, stairs, transfers, bathing, locomotion level, and caring for others  PARTICIPATION LIMITATIONS: meal prep, cleaning, laundry, shopping, community activity, occupation,  and yard work  PERSONAL FACTORS: Past/current experiences, Profession, Time since onset of injury/illness/exacerbation, and 3+ comorbidities: anxiety, depression, asthma, Ehlers-Danlos/hypermobility syndrome, IBS are also affecting patient's functional outcome.   REHAB POTENTIAL: Good  CLINICAL DECISION MAKING: Evolving/moderate complexity  EVALUATION COMPLEXITY: Moderate   GOALS: Goals reviewed with patient? Yes  SHORT TERM GOALS: Target date: 12/01/2023  Patient will be independent with initial HEP. Baseline: Initial HEP provided on eval Goal status: MET - 11/11/23 - pt is doing HEP consistently   2.  Patient will report at least 25% improvement in L foot/ankle/calf pain to improve QOL. Baseline: 2/10 on eval, up to 8/10 Goal status: MET - 11/18/23  3.  Patient will improve 5x STS time to </= 15 seconds to demonstrate improved functional strength and transfer efficiency.  Baseline: 20.25 sec Goal status: MET - 11/25/23 - 13.25 sec   LONG TERM GOALS: Target date: 01/05/2024  Patient will be independent with advanced/ongoing HEP to improve outcomes and carryover.  Baseline:  Goal status: IN PROGRESS - 12/23/23 - HEP helping with pain and stiffness  2.  Patient will report at least 50-75% improvement in L foot/ankle/calf pain to improve QOL. Baseline: 2/10 on eval, up to 8/10 Goal status: MET - 12/02/23   3.  Patient will demonstrate improved L ankle AROM to [facility] to allow for normal gait and stair mechanics. Baseline: Refer to above LE ROM table Goal status: MET - 12/17/23  4.  Patient will demonstrate improved B LE strength to >/= 4+/5 with exception of L PF to >/= 3+/5 for improved stability and ease of mobility. Baseline: Refer to above LE MMT table Goal status: PARTIALLY MET - 12/02/23  5.  Patient will be able to ambulate ~1 mile with normal gait pattern without increased foot/ankle pain to access community.  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 Patient will be able to ascend/descend stairs with 1 HR and reciprocal step pattern safely to access home and community.  Baseline:  Goal status: PARTIALLY MET - 12/17/23 - increased pain with stairs   7.  Patient will report >/= 45/80 on LEFS to demonstrate improved functional ability. Baseline: 32 / 80 = 40.0 % Goal status: MET10/3/25  55 / 80 = 68.8 %  8.  Patient will improve L SLS to >/= 20 sec to demonstrate improved L ankle stability. Baseline: 10.78 sec 11/13/23 -  L SLS with 2 x 30 with intermittent light 1 pole  Goal status: MET - 12/23/23 - R = 37.75 sec, L = 37.66 sec   PLAN:  PT FREQUENCY: 2x/week  PT DURATION: 8-10 weeks   PLANNED INTERVENTIONS: 02835- PT Re-evaluation, 97750- Physical Performance Testing, 97110-Therapeutic exercises, 97530- Therapeutic activity, 97112- Neuromuscular re-education, 97535- Self Care, 02859- Manual therapy, [phone]- Gait training, [phone]- Aquatic Therapy, [phone]- Electrical stimulation (unattended), 97016- Vasopneumatic device, [id number]- Ultrasound, [id number]- Ionotophoresis 4mg /ml Dexamethasone , 79439 (1-2 muscles), 20561 (3+ muscles)- Dry Needling, Patient/Family education, Balance training, Stair training, Taping, Joint mobilization, Scar mobilization, DME instructions, Cryotherapy, and Moist heat  PLAN FOR NEXT SESSION: BOSU step ups, balance beam exercises, perturbation training with black TB   Mansi Tokar L Nnamdi Dacus, PTA 12/26/2023, 8:53 AM

## 2023-12-30 ENCOUNTER — Ambulatory Visit: Admitting: Physical Therapy

## 2023-12-30 ENCOUNTER — Encounter: Payer: Self-pay | Admitting: Physical Therapy

## 2023-12-30 DIAGNOSIS — R600 Localized edema: Secondary | ICD-10-CM

## 2023-12-30 DIAGNOSIS — M6281 Muscle weakness (generalized): Secondary | ICD-10-CM

## 2023-12-30 DIAGNOSIS — M25572 Pain in left ankle and joints of left foot: Secondary | ICD-10-CM | POA: Diagnosis not present

## 2023-12-30 DIAGNOSIS — R2689 Other abnormalities of gait and mobility: Secondary | ICD-10-CM | POA: Diagnosis not present

## 2023-12-30 NOTE — Therapy (Signed)
 OUTPATIENT PHYSICAL THERAPY LOWER EXTREMITY TREATMENT   Patient Name: Dana [name] MRN: [id number] DOB:1995-08-27, 28 y.o., female Today's Date: 12/30/2023   END OF SESSION:  PT End of Session - 12/30/23 0805     Visit Number 12    Date for Recertification  01/05/24    Authorization Type BCBS & [facility] Medicaid    PT Start Time 0805   Pt arrived late   PT Stop Time 0849    PT Time Calculation (min) 44 min    Activity Tolerance Patient tolerated treatment well    Behavior During Therapy [facility] for tasks assessed/performed               Past Medical History:  Diagnosis Date   Anxiety    Anxiety disorder of adolescence 06/09/2015   Arm fracture    Asthma    Constipation    alternates with diarrhea   Depression    Diarrhea    alternates with constipation   Ehlers-Danlos syndrome    per patient   GERD (gastroesophageal reflux disease)    Inflammatory bowel disease    Insomnia 06/09/2015   Past Surgical History:  Procedure Laterality Date   FOOT ARTHRODESIS Left 07/11/2023   Procedure: FUSION, JOINT, FOOT;  Surgeon: Silva Juliene SAUNDERS, DPM;  Location: [facility] ORS;  Service: Podiatry;  Laterality: Left;  MID FOOT FUSION   HALLUX VALGUS AKIN Left 07/11/2023   Procedure: CORRECTION, HALLUX VALGUS, WITH AKIN OSTEOTOMY;  Surgeon: Silva Juliene SAUNDERS, DPM;  Location: [facility] ORS;  Service: Podiatry;  Laterality: Left;  GENERAL WITH BLOCK   HALLUX VALGUS LAPIDUS Left 07/11/2023   Procedure: ROMAYNE LOOK;  Surgeon: Silva Juliene SAUNDERS, DPM;  Location: [facility] ORS;  Service: Podiatry;  Laterality: Left;   NO PAST SURGERIES     Patient Active Problem List   Diagnosis Date Noted   Metatarsus primus varus of left foot 07/13/2023   Acquired hallux valgus of left foot 07/13/2023   Metatarsus adductus of left foot 07/13/2023   Major osseous defect 07/13/2023   Otalgia of both ears 03/06/2023   Body aches 03/06/2023   Hypermobility syndrome 02/18/2023   Suicidal ideation 12/19/2015    MDD (major depressive disorder), recurrent episode, severe (HCC) 12/18/2015   Insomnia 06/09/2015   Anxiety disorder of adolescence 06/09/2015   Severe episode of recurrent major depressive disorder, without psychotic features (HCC) 06/02/2015   Alternating constipation and diarrhea    Nausea & vomiting 09/29/2011   IBS (irritable bowel syndrome) 06/28/2011   GERD (gastroesophageal reflux disease) 06/28/2011   Back pain 08/01/2010   DYSURIA 08/17/2009   ACUTE PHARYNGITIS 04/06/2009   ACUTE BRONCHITIS 04/06/2009   Acute gastritis 04/27/2007   Anxiety state 02/11/2007   Allergic rhinitis 02/11/2007   GERD 12/25/2006   LOOSE STOOLS 12/25/2006   RUQ PAIN 12/25/2006    PCP: Antonio Cyndee Jamee SAUNDERS, DO   REFERRING PROVIDER: Silva Juliene SAUNDERS, DPM   REFERRING DIAG:  M20.12,M21.612 (ICD-10-CM) - Hallux valgus with bunions, left  Q66.229 (ICD-10-CM) - Metatarsus adductus, congenital   Postop bunionectomy 3 months ago difficulty ambulating and swelling in ankle, work on joint manipulation range of motion strengthening and gait training. Evaluate and treat modalities as needed at your discretion twice weekly for 6 to 8 weeks.   THERAPY DIAG:  Pain in left ankle and joints of left foot  Muscle weakness (generalized)  Other abnormalities of gait and mobility  Localized edema  RATIONALE FOR EVALUATION AND TREATMENT: Rehabilitation  ONSET DATE: 07/11/2023 Surgery/procedure:  Lapidus Bunionectomy Correction metatarsus adductus with 2nd and 3rd TMT fusion with osteotomy Akin phalangeal osteotomy  NEXT MD VISIT: 01/22/24   SUBJECTIVE:                                                                                                                                                                                                         SUBJECTIVE STATEMENT: Pt reports she has been having pain on the bottom of her heel - feels like it is bruised.  PAIN: Are you having pain? Yes: NPRS  scale: 2/10  Pain location: plantar aspect of L heel  Pain description: bruised, aching  Aggravating factors: standing/walking - anytime there is pressure on it  Relieving factors: taking the pressure off   PERTINENT HISTORY:  anxiety, depression, asthma, Ehlers-Danlos/hypermobility syndrome, IBS  PRECAUTIONS: None - No restrictions on activity. Regular shoe gear as tolerated.   RED FLAGS: None  WEIGHT BEARING RESTRICTIONS: No  FALLS:  Has patient fallen in last 6 months? Yes. 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   OBJECTIVE: (objective measures completed at initial evaluation unless otherwise dated)  DIAGNOSTIC FINDINGS:  Not available  PATIENT SURVEYS:  LEFS: 32 / 80 = 40.0 %, moderate functional limitation  COGNITION: Overall cognitive status: Within functional limits for tasks assessed    SENSATION: Continued numbness and tingling in L foot, ankle and calf since surgery (slightly better in calf)  EDEMA:  Figure 8: R = 48.0 cm, L = 50.0 cm    POSTURE:  B pes planus in standing  PALPATION: Decreased incisional scar mobility, most notable on medial incision Decreased mid and hindfoot mobility Increased muscle tension/tightness in L gastroc/soleus, fibularis muscles, and anterior tibialis  MUSCLE LENGTH:  Heelcord: Mild tight L>R  LOWER EXTREMITY ROM:  Active ROM Right eval Left eval L 11/21/23 L 12/17/23  Ankle dorsiflexion 12 1 10 15   Ankle plantarflexion 71 49 50 60  Ankle inversion 40 27 38 36   Ankle eversion 25 12 28 30    Passive ROM Left eval  Ankle dorsiflexion 6  Ankle plantarflexion 50  Ankle inversion 34  Ankle  eversion 14  (Blank rows = not tested)  LOWER EXTREMITY MMT:  MMT Right eval Left eval R 12/02/23 L 12/02/23 R 12/30/23 L 12/30/23  Hip flexion 5 4- 5 5 5 5   Hip extension 4+ 3+ 4+ 4+ 5 5  Hip abduction 4+ 4- 4+ 4+ 4+ 4  Hip adduction 4+ 4+ 4+ 4 5 4+  Hip internal rotation 4+ 4- 4+ 4+ 5 5  Hip external rotation 4- 3+ 4+ 4+ 4+ 4  Knee flexion 5 5   5 5   Knee extension 5 4+   5 5  Ankle dorsiflexion 5 3+ 5 4+ 5 5  Ankle plantarflexion 4- (8 SLS HR) 2 17 heel raises 13 Heel raises    Ankle inversion 5 3 5  4+ 5 5  Ankle eversion 5 3- 5 4 5  4+   (Blank rows = not tested)  FUNCTIONAL TESTS:  5 times sit to stand: 20.25 sec  SLS: R = 37.66 sec, L = 10.78 sec  GAIT: Distance walked: Clinic distances Assistive device utilized: None Level of assistance: Complete Independence Gait pattern: decreased step length- Right, decreased stance time- Left, decreased ankle dorsiflexion- Left, antalgic, and lateral lean- Right   TODAY'S TREATMENT:   12/30/23 THERAPEUTIC EXERCISE: To improve strength, endurance, ROM, and flexibility.  Demonstration, verbal and tactile cues throughout for technique.  Elliptical - L2.0 x 6 min  MANUAL THERAPY: To promote reduced pain and increased stability utilizing kinesiotaping.  K-Tape to L ankle - 2 I strips: Stirrup to medial and lateral ankle ~50% 30% along posterior tibialis  THERAPEUTIC ACTIVITIES: To improve functional performance.  Demonstration, verbal and tactile cues throughout for technique. Resisted gait with waist belt to simulate her dog walking belt 2 x 50 each - forward, back and L/R LE MMT  NEUROMUSCULAR RE-EDUCATION: To improve balance, coordination, kinesthesia, proprioception, and reduce fall risk.  L SLS + B GTB pallof press x 15 each direction Side plank + GTB hip ABD/ER clam   12/26/23 Bike L6x30min   Resisted walking Black TB with lateral perturbations 146ft 3x Step downs 8' leading RLE 2x10 Squats on upside down BOSU x 10 SLS with shoulder ext RTB 2x10 SLS with lateral oscillations  x 10- harder L SLS on BOSU ball 2x10- intermittent UE support   12/23/23 THERAPEUTIC EXERCISE: To improve strength and endurance.   NuStep - L6 x 6 min (UE/LE)  THERAPEUTIC ACTIVITIES: To improve functional performance.  Demonstration, verbal and tactile cues throughout for technique. SLS: R = 37.75 sec, L = 37.66 sec w/o UE support  MANUAL THERAPY: To promote normalized muscle tension, improved flexibility, improved joint mobility, increased ROM, and reduced pain utilizing joint mobilization, connective tissue massage, therapeutic massage, myofascial release, and scar mobilization. L ankle talocrural, intertarsal, and tarsal-metatarsal mobilization STM/DTM and XFM to dorsum of L ankle and distal anterior tibialis  NEUROMUSCULAR RE-EDUCATION: To improve balance, coordination, kinesthesia, posture, proprioception, and reduce fall risk. SLS + GTB 4-way SLR x 10 bil, rare/occasional UE support on back of chair for balance   12/19/23 NEUROMUSCULAR RE-EDUCATION: To improve coordination, kinesthesia, posture, and proprioception.  Step ups BOSU LLE x 20 Lateral step ups BOSU LLE x 20 Resisted gait black TB with perturbations side to side 2x150'; additional 150 ft with occasional reactionary balance Fwd/retro gait walking on unsteady surfaces 10x each way- some LOB with retro stepping [place] on unsteady surface 3x each way not as hard  THERAPEUTIC EXERCISE: To improve strength, endurance, ROM, and flexibility.  Nustep L5x6min Seated  ankle EV and IV x 20 GTB Calf raise from 2' book x 20   12/17/23 NEUROMUSCULAR RE-EDUCATION: To improve coordination, kinesthesia, posture, and proprioception.  Step ups BOSU LLE x 10 Lateral step ups BOSU LLE x 10 Standing balance on BOSU BLE 2x30  THERAPEUTIC  EXERCISE: To improve strength, endurance, ROM, and flexibility.  Nustep L6x19min Assessment of stairs: [facility], patient reports slight increase in pain w/ stairs Toe curls with towel on floor x 20 Standing gastroc and soleus stretch x 30' each with blue band dorsal glide Standing calf raises from 2' book    12/02/23 THERAPEUTIC EXERCISE: To improve strength, endurance, ROM, and flexibility.  Demonstration, verbal and tactile cues throughout for technique.  NuStep - L5 x 7 min (UE/LE) Tested strength Seated self-anchored RTB ankle eversion x 20 Seated self-anchored RTB ankle inversion x 20  THERAPEUTIC ACTIVITIES: To improve stair climbing ability  Step ups LLE 8' 2 x 10  Lateral step ups LLE 8' 2 x 10  NEUROMUSCULAR RE-EDUCATION: To improve balance and proprioception Tandem walk on balance beam fwd and back 3x Sidestep with EC on balance beam 3x both ways   11/25/23 THERAPEUTIC EXERCISE: To improve strength, endurance, ROM, and flexibility.  Demonstration, verbal and tactile cues throughout for technique.  NuStep - L6 x 6 min (UE/LE) Seated self-anchored RTB ankle eversion 2 x 10 Seated self-anchored RTB ankle DF 2 x 10 Seated self-anchored RTB ankle inversion 2 x 10 Seated self-anchored blue TB ankle PF 2 x 10 L ankle RTB toe curls 2 x 10  SELF CARE:  Provided education on post-op incisional scar tissue mobilization and desensitization techniques rubbing with different textures .   THERAPEUTIC ACTIVITIES: To improve functional performance.  Demonstration, verbal and tactile cues throughout for technique. 5xSTS = 13.25 sec  NEUROMUSCULAR RE-EDUCATION: To improve coordination, kinesthesia, proprioception, amplitude of movement, and reduce rigidity. L ankle PNF D1 & D2 diagonals 3 x 10 - 1 set w/o resistance, 2nd set resisting DF diagonals, 3rd set resisting PF diagonals Bridge + RTB B hip ABD/ER clam (maintaining level pelvis) 2 x 10 Bridge + RTB march x 10   11/21/23 THERAPEUTIC  EXERCISE: To improve strength and endurance.  Demonstration, verbal and tactile cues throughout for technique.  NuStep - L6 x 6 min (UE/LE) Seated self-anchored RTB ankle eversion 2 x 10 Seated self-anchored RTB ankle DF 2 x 10 Seated self-anchored RTB ankle inversion 2 x 10 Clamshell RTB x 20 R/L in sidelying  NEUROMUSCULAR RE-EDUCATION: To improve balance, coordination, kinesthesia, proprioception, and reduce fall risk. Standing hip abduction RTB at ankles x 10 B Standing hip extension RTB at ankles x 10 B Standing hip ABD to ext toe taps RTB at ankles x 10 B Sidestep 2x along counter RTB at ankles   11/18/23 THERAPEUTIC EXERCISE: To improve strength and endurance.  Demonstration, verbal and tactile cues throughout for technique.  NuStep - L6 x 6 min (UE/LE) Seated self-anchored YTB ankle eversion 2 x 10 Seated self-anchored YTB ankle DF 2 x 10 Seated self-anchored YTB ankle inversion 2 x 10 Seated self-anchored GTB ankle PF 2 x 10  NEUROMUSCULAR RE-EDUCATION: To improve balance, coordination, kinesthesia, proprioception, and reduce fall risk. Seated BAPS - L3 DF/PF 2 x 10 (keeping sides level) 2.5 weights on lateral, then medial side x 10 each IV/EV 2 x 10 - more difficult CW/CCW x 20 SLS 2 x 30 L - no UE support Standing L DF/knee flexion mobilization on slant board 5x3   11/13/23 THERAPEUTIC  EXERCISE: To improve strength and endurance.  Demonstration, verbal and tactile cues throughout for technique.  NuStep - L6 x 6 min (UE/LE) Seated self-anchored YTB ankle eversion 2 x 10 Seated self-anchored YTB ankle DF 2 x 10 Seated self-anchored YTB ankle inversion 2 x 10 Seated self-anchored GTB ankle PF 2 x 10  MANUAL THERAPY: To promote improved flexibility, improved joint mobility, increased ROM, and reduced pain utilizing joint mobilization.  L ankle subtalar and tarsal-metatarsal joint mobs - grade II-III  NEUROMUSCULAR RE-EDUCATION: To improve balance, coordination,  kinesthesia, proprioception, and reduce fall risk. Seated BAPS - L3 DF/PF 2 x 10 (keeping sides level) IV/EV 2 x 10 (keeping front/back level) - more difficult CW/CCW x 10 SLS 2 x 30 L - intermittent light support from single pole   11/11/23  THERAPEUTIC EXERCISE: To improve strength, endurance, ROM, and flexibility.  Demonstration, verbal and tactile cues throughout for technique.  S/L clams w/ YTB at distal thighs 2x10 B  Seated Resisted DF AROM w/ YTB 2x10 B  Seated Resisted PF AROM w/ YTB 2x10 B  Seated Resisted ankle eversion w/ YTB 2x10 B   NEURO RE-ED (corner balance) Static Stance x30  Static Stance with eyes closed x30- L ankle feels less steady  Staggered stance x30 B- more difficult w/LLE back  Tandem Stance x30 B- harder with LLE back  SLS x30 B- much more difficult on LLE, pain in front and lateral ankle   SELF CARE: Provided education for HEP review including recommended frequency for home performance.  Reviewed HEP w/ patient  Gastroc Stretch 2x30  Soleus Stretch 2x30  Ankle circles x10 ea direction Heel/Toe Raises x20    10/27/2023  SELF CARE:  Reviewed eval findings and role of PT in addressing identified deficits as well as instruction in initial HEP (see below).    PATIENT EDUCATION:  Education details: HEP progression - clam progressed to side plank  Person educated: Patient Education method: Explanation, Demonstration, Verbal cues, and MedBridgeGO app updated Education comprehension: verbalized understanding, returned demonstration, verbal cues required, and needs further education  HOME EXERCISE PROGRAM: *Pt using MedBridgeGO app.  Access Code: [email or web address] URL: [email or web address] Date: 12/30/2023 Prepared by: Elijah Hidden  Exercises - Gastroc Stretch on Wall (Mirrored)  - 2-3 x daily - 7 x weekly - 3 reps - 30 sec hold - Standing Soleus Stretch  - 2-3 x daily - 7 x weekly - 3 reps - 30 sec hold - Ankle Circles in Elevation  -  2-3 x daily - 7 x weekly - 2 sets - 10 reps - Seated Heel Toe Raises  - 2-3 x daily - 7 x weekly - 2 sets - 10 reps - 3 sec hold - Long Sitting Ankle Plantar Flexion with Resistance  - 1 x daily - 7 x weekly - 2 sets - 10 reps - 3 sec hold - Seated Ankle Dorsiflexion with Resistance  - 1 x daily - 7 x weekly - 2 sets - 10 reps - 3 sec hold - Seated Ankle Eversion with Resistance  - 1 x daily - 7 x weekly - 2 sets - 10 reps - 3 sec hold - Seated Ankle Inversion with Resistance  - 1 x daily - 7 x weekly - 2 sets - 10 reps - 3 sec hold - Hip Abduction with Resistance Loop  - 1 x daily - 7 x weekly - 2 sets - 10 reps - Hip Extension with Resistance Loop  - 1 x daily - 7  x weekly - 2 sets - 10 reps - Side Plank with Clam and Resistance  - 1 x daily - 3 x weekly - 2-3 sets - 5-10 reps - 3 sec hold  Patient Education - Rubbing with Different Textures   ASSESSMENT:  CLINICAL IMPRESSION: Dana [name] reports dorsal L foot/ankle pain now resolved but not experiencing pain on lateral aspect of plantar surface of her L heel which feels like a bruise - no apparent bruising evident.  Tried K-tape application to provide support and offload lateral heel with some relief noted.  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  Pt noted to have difficulty at hip level with sustained SLS and MMT revealing continued L hip ABD and ER weakness, therefore advanced strengthening with GTB clam in side-plank position to address this ongoing weakness.  Dana [name] will benefit from continued skilled PT to address ongoing ROM, strength, proprioception and balance deficits to improve mobility and activity tolerance with decreased pain interference.  She is nearing the end of her current POC and will hopefully be ready to transition to her HEP at that time pending status with  remaining goal assessment.  EVAL: Dana [name] is a 28 y.o. female who was referred to physical therapy for evaluation and treatment s/p correction hallux valgus left foot with metatarsus adductus correction and Akin osteotomy on 07/11/23.  Patient has been released from any postop restrictions and cleared to resume normal activity including running as tolerated.   Patient reports L foot ankle and calf pain persist since surgery.  Pain is worse with prolonged sitting or standing, or walking long distances.  Patient has deficits in L foot/ankle ROM, distal L LE flexibility, L>R LE strength, abnormal posture, and TTP with abnormal muscle tension which are interfering with ADLs and are impacting quality of life.  On LEFS patient scored 32/80 demonstrating moderate functional limitation.  Dana [name] will benefit from skilled PT to address above deficits to improve mobility and activity tolerance with decreased pain interference prior to proceeding with surgery for R foot.  OBJECTIVE IMPAIRMENTS: Abnormal gait, decreased activity tolerance, decreased balance, decreased endurance, decreased knowledge of condition, decreased mobility, difficulty walking, decreased ROM, decreased strength, hypomobility, increased edema, increased fascial restrictions, impaired perceived functional ability, increased muscle spasms, impaired flexibility, impaired sensation, improper body mechanics, postural dysfunction, pain, and hypermobility.   ACTIVITY LIMITATIONS: carrying, lifting, bending, sitting, standing, squatting, sleeping, stairs, transfers, bathing, locomotion level, and caring for others  PARTICIPATION LIMITATIONS: meal prep, cleaning, laundry, shopping, community activity, occupation, and yard work  PERSONAL FACTORS: Past/current experiences, Profession, Time since onset of injury/illness/exacerbation, and 3+ comorbidities: anxiety, depression, asthma, Ehlers-Danlos/hypermobility syndrome, IBS are also affecting  patient's functional outcome.   REHAB POTENTIAL: Good  CLINICAL DECISION MAKING: Evolving/moderate complexity  EVALUATION COMPLEXITY: Moderate   GOALS: Goals reviewed with patient? Yes  SHORT TERM GOALS: Target date: 12/01/2023  Patient will be independent with initial HEP. Baseline: Initial HEP provided on eval Goal status: MET - 11/11/23 - pt is doing HEP consistently   2.  Patient will report at least 25% improvement in L foot/ankle/calf pain to improve QOL. Baseline: 2/10 on eval, up to 8/10 Goal status: MET - 11/18/23  3.  Patient will improve 5x STS time to </= 15 seconds to demonstrate improved functional strength and transfer efficiency.  Baseline: 20.25 sec Goal status: MET - 11/25/23 - 13.25 sec  LONG TERM GOALS: Target date: 01/05/2024  Patient will be independent with advanced/ongoing HEP to improve outcomes and carryover.  Baseline:  Goal status: IN PROGRESS - 12/23/23 - HEP helping with pain and stiffness  2.  Patient will report at least 50-75% improvement in L foot/ankle/calf pain to improve QOL. Baseline: 2/10 on eval, up to 8/10 Goal status: MET - 12/02/23   3.  Patient will demonstrate improved L ankle AROM to [facility] to allow for normal gait and stair mechanics. Baseline: Refer to above LE ROM table Goal status: MET - 12/17/23  4.  Patient will demonstrate improved B LE strength to >/= 4+/5 with exception of L PF to >/= 3+/5 for improved stability and ease of mobility. Baseline: Refer to above LE MMT table Goal status: PARTIALLY MET - 12/30/23 - met except L hip ABD and ER 4/5  5.  Patient will be able to ambulate ~1 mile with normal gait pattern without increased foot/ankle pain to access community.  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  Patient will be able to ascend/descend stairs with 1 HR and reciprocal step pattern safely to access home and community.  Baseline:  Goal status: PARTIALLY MET - 12/17/23 - increased pain with stairs   7.  Patient will report >/= 45/80 on LEFS to demonstrate improved functional ability. Baseline: 32 / 80 = 40.0 % Goal status: MET10/3/25  55 / 80 = 68.8 %  8.  Patient will improve L SLS to >/= 20 sec to demonstrate improved L ankle stability. Baseline: 10.78 sec 11/13/23 - L SLS with 2 x 30 with intermittent light 1 pole  Goal status: MET - 12/23/23 - R = 37.75 sec, L = 37.66 sec   PLAN:  PT FREQUENCY: 2x/week  PT DURATION: 8-10 weeks   PLANNED INTERVENTIONS: 02835- PT Re-evaluation, 97750- Physical Performance Testing, 97110-Therapeutic exercises, 97530- Therapeutic activity, 97112- Neuromuscular re-education, 97535- Self Care, 02859- Manual therapy, [id number]- Gait training, [phone]- Aquatic Therapy, [phone]- Electrical stimulation (unattended), [id number]- Vasopneumatic device, [id number]- Ultrasound, [id number]- Ionotophoresis 4mg /ml Dexamethasone , [id number] (1-2 muscles), [id number] (3+ muscles)- Dry Needling, Patient/Family education, Balance training, Stair training, Taping, Joint mobilization, Scar mobilization, DME instructions, Cryotherapy, and Moist heat  PLAN FOR NEXT SESSION: BOSU step ups, balance beam exercises, perturbation training with black TB   Elijah CHRISTELLA Hidden, PT 12/30/2023, 9:51 AM

## 2024-01-02 ENCOUNTER — Ambulatory Visit

## 2024-01-02 DIAGNOSIS — M6281 Muscle weakness (generalized): Secondary | ICD-10-CM | POA: Diagnosis not present

## 2024-01-02 DIAGNOSIS — R2689 Other abnormalities of gait and mobility: Secondary | ICD-10-CM | POA: Diagnosis not present

## 2024-01-02 DIAGNOSIS — R600 Localized edema: Secondary | ICD-10-CM | POA: Diagnosis not present

## 2024-01-02 DIAGNOSIS — M25572 Pain in left ankle and joints of left foot: Secondary | ICD-10-CM | POA: Diagnosis not present

## 2024-01-02 NOTE — Therapy (Signed)
 OUTPATIENT PHYSICAL THERAPY LOWER EXTREMITY TREATMENT   Patient Name: Dana [name] MRN: [id number] DOB:06/22/1995, 28 y.o., female Today's Date: 01/02/2024   END OF SESSION:  PT End of Session - 01/02/24 0806     Visit Number 13    Date for Recertification  01/05/24    Authorization Type BCBS & [facility] Medicaid    PT Start Time 0804    PT Stop Time 0848    PT Time Calculation (min) 44 min    Activity Tolerance Patient tolerated treatment well    Behavior During Therapy [facility] for tasks assessed/performed               Past Medical History:  Diagnosis Date   Anxiety    Anxiety disorder of adolescence 06/09/2015   Arm fracture    Asthma    Constipation    alternates with diarrhea   Depression    Diarrhea    alternates with constipation   Ehlers-Danlos syndrome    per patient   GERD (gastroesophageal reflux disease)    Inflammatory bowel disease    Insomnia 06/09/2015   Past Surgical History:  Procedure Laterality Date   FOOT ARTHRODESIS Left 07/11/2023   Procedure: FUSION, JOINT, FOOT;  Surgeon: Silva Juliene SAUNDERS, DPM;  Location: [facility] ORS;  Service: Podiatry;  Laterality: Left;  MID FOOT FUSION   HALLUX VALGUS AKIN Left 07/11/2023   Procedure: CORRECTION, HALLUX VALGUS, WITH AKIN OSTEOTOMY;  Surgeon: Silva Juliene SAUNDERS, DPM;  Location: [facility] ORS;  Service: Podiatry;  Laterality: Left;  GENERAL WITH BLOCK   HALLUX VALGUS LAPIDUS Left 07/11/2023   Procedure: ROMAYNE LOOK;  Surgeon: Silva Juliene SAUNDERS, DPM;  Location: [facility] ORS;  Service: Podiatry;  Laterality: Left;   NO PAST SURGERIES     Patient Active Problem List   Diagnosis Date Noted   Metatarsus primus varus of left foot 07/13/2023   Acquired hallux valgus of left foot 07/13/2023   Metatarsus adductus of left foot 07/13/2023   Major osseous defect 07/13/2023   Otalgia of both ears 03/06/2023   Body aches 03/06/2023   Hypermobility syndrome 02/18/2023   Suicidal ideation 12/19/2015   MDD (major  depressive disorder), recurrent episode, severe (HCC) 12/18/2015   Insomnia 06/09/2015   Anxiety disorder of adolescence 06/09/2015   Severe episode of recurrent major depressive disorder, without psychotic features (HCC) 06/02/2015   Alternating constipation and diarrhea    Nausea & vomiting 09/29/2011   IBS (irritable bowel syndrome) 06/28/2011   GERD (gastroesophageal reflux disease) 06/28/2011   Back pain 08/01/2010   DYSURIA 08/17/2009   ACUTE PHARYNGITIS 04/06/2009   ACUTE BRONCHITIS 04/06/2009   Acute gastritis 04/27/2007   Anxiety state 02/11/2007   Allergic rhinitis 02/11/2007   GERD 12/25/2006   LOOSE STOOLS 12/25/2006   RUQ PAIN 12/25/2006    PCP: Antonio Cyndee Jamee SAUNDERS, DO   REFERRING PROVIDER: Silva Juliene SAUNDERS, DPM   REFERRING DIAG:  M20.12,M21.612 (ICD-10-CM) - Hallux valgus with bunions, left  Q66.229 (ICD-10-CM) - Metatarsus adductus, congenital   Postop bunionectomy 3 months ago difficulty ambulating and swelling in ankle, work on joint manipulation range of motion strengthening and gait training. Evaluate and treat modalities as needed at your discretion twice weekly for 6 to 8 weeks.   THERAPY DIAG:  Pain in left ankle and joints of left foot  Muscle weakness (generalized)  Other abnormalities of gait and mobility  Localized edema  RATIONALE FOR EVALUATION AND TREATMENT: Rehabilitation  ONSET DATE: 07/11/2023 Surgery/procedure:  LOOK Bunionectomy Correction metatarsus  adductus with 2nd and 3rd TMT fusion with osteotomy Akin phalangeal osteotomy  NEXT MD VISIT: 01/22/24   SUBJECTIVE:                                                                                                                                                                                                         SUBJECTIVE STATEMENT: Pt reports she has been having pain on the bottom of her heel - pointing to medial and lateral ankle  PAIN: Are you having pain? Yes: NPRS  scale: 2/10  Pain location: plantar aspect of L heel  Pain description: bruised, aching  Aggravating factors: standing/walking - anytime there is pressure on it  Relieving factors: taking the pressure off   PERTINENT HISTORY:  anxiety, depression, asthma, Ehlers-Danlos/hypermobility syndrome, IBS  PRECAUTIONS: None - No restrictions on activity. Regular shoe gear as tolerated.   RED FLAGS: None  WEIGHT BEARING RESTRICTIONS: No  FALLS:  Has patient fallen in last 6 months? Yes. 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   OBJECTIVE: (objective measures completed at initial evaluation unless otherwise dated)  DIAGNOSTIC FINDINGS:  Not available  PATIENT SURVEYS:  LEFS: 32 / 80 = 40.0 %, moderate functional limitation  COGNITION: Overall cognitive status: Within functional limits for tasks assessed    SENSATION: Continued numbness and tingling in L foot, ankle and calf since surgery (slightly better in calf)  EDEMA:  Figure 8: R = 48.0 cm, L = 50.0 cm    POSTURE:  B pes planus in standing  PALPATION: Decreased incisional scar mobility, most notable on medial incision Decreased mid and hindfoot mobility Increased muscle tension/tightness in L gastroc/soleus, fibularis muscles, and anterior tibialis  MUSCLE LENGTH:  Heelcord: Mild tight L>R  LOWER EXTREMITY ROM:  Active ROM Right eval Left eval L 11/21/23 L 12/17/23  Ankle dorsiflexion 12 1 10 15   Ankle plantarflexion 71 49 50 60  Ankle inversion 40 27 38 36   Ankle eversion 25 12 28 30    Passive ROM Left eval  Ankle dorsiflexion 6  Ankle plantarflexion 50  Ankle inversion 34  Ankle eversion 14  (  Blank rows = not tested)  LOWER EXTREMITY MMT:  MMT Right eval Left eval R 12/02/23 L 12/02/23 R 12/30/23 L 12/30/23  Hip flexion 5 4- 5 5 5 5   Hip extension 4+ 3+ 4+ 4+ 5 5  Hip abduction 4+ 4- 4+ 4+ 4+ 4  Hip adduction 4+ 4+ 4+ 4 5 4+  Hip internal rotation 4+ 4- 4+ 4+ 5 5  Hip external rotation 4- 3+ 4+ 4+ 4+ 4  Knee flexion 5 5   5 5   Knee extension 5 4+   5 5  Ankle dorsiflexion 5 3+ 5 4+ 5 5  Ankle plantarflexion 4- (8 SLS HR) 2 17 heel raises 13 Heel raises    Ankle inversion 5 3 5  4+ 5 5  Ankle eversion 5 3- 5 4 5  4+   (Blank rows = not tested)  FUNCTIONAL TESTS:  5 times sit to stand: 20.25 sec  SLS: R = 37.66 sec, L = 10.78 sec  GAIT: Distance walked: Clinic distances Assistive device utilized: None Level of assistance: Complete Independence Gait pattern: decreased step length- Right, decreased stance time- Left, decreased ankle dorsiflexion- Left, antalgic, and lateral lean- Right   TODAY'S TREATMENT:  01/02/24 THERAPEUTIC EXERCISE: To improve strength, endurance, ROM, and flexibility.  Demonstration, verbal and tactile cues throughout for technique.  Bike L2x14min NEUROMUSCULAR RE-EDUCATION: To improve balance, coordination, kinesthesia, proprioception, and reduce fall risk.  Side planks with clamshell B Planks with hip ext B Standing hip abduction, extension RTB at ankles 2 x 10 B Resisted walking fwd with perturbation, lateral walking with perturbations KT tape for L ankle one strip bottom of foot along peroneus longus, cross strip along dorsum of ankle across lateral malleolus   12/30/23 THERAPEUTIC EXERCISE: To improve strength, endurance, ROM, and flexibility.  Demonstration, verbal and tactile cues throughout for technique.  Elliptical - L2.0 x 6 min  MANUAL THERAPY: To promote reduced pain and increased stability  utilizing kinesiotaping.  K-Tape to L ankle - 2 I strips: Stirrup to medial and lateral ankle ~50% 30% along posterior tibialis  THERAPEUTIC ACTIVITIES: To improve functional performance.  Demonstration, verbal and tactile cues throughout for technique. Resisted gait with waist belt to simulate her dog walking belt 2 x 50 each - forward, back and L/R LE MMT  NEUROMUSCULAR RE-EDUCATION: To improve balance, coordination, kinesthesia, proprioception, and reduce fall risk.  L SLS + B GTB pallof press x 15 each direction Side plank + GTB hip ABD/ER clam   12/26/23 Bike L6x20min  Resisted walking Black TB with lateral perturbations 170ft 3x Step downs 8' leading RLE 2x10 Squats on upside down BOSU x 10 SLS with shoulder ext RTB 2x10 SLS with lateral oscillations  x 10- harder L SLS on BOSU ball 2x10- intermittent UE support   12/23/23 THERAPEUTIC EXERCISE: To improve strength and endurance.   NuStep - L6 x 6 min (UE/LE)  THERAPEUTIC ACTIVITIES: To improve functional performance.  Demonstration, verbal and tactile cues throughout for technique. SLS: R = 37.75 sec, L = 37.66 sec w/o UE support  MANUAL THERAPY: To promote normalized muscle tension, improved flexibility, improved joint mobility, increased ROM, and reduced pain utilizing joint mobilization, connective tissue massage, therapeutic massage, myofascial release, and scar mobilization. L ankle talocrural, intertarsal, and tarsal-metatarsal mobilization STM/DTM and XFM to dorsum of L ankle and distal anterior tibialis  NEUROMUSCULAR RE-EDUCATION: To improve balance, coordination, kinesthesia, posture, proprioception, and reduce fall risk. SLS + GTB 4-way SLR x 10 bil, rare/occasional UE support on back  of chair for balance   12/19/23 NEUROMUSCULAR RE-EDUCATION: To improve coordination, kinesthesia, posture, and proprioception.  Step ups BOSU LLE x 20 Lateral step ups BOSU LLE x 20 Resisted gait black TB with perturbations  side to side 2x150'; additional 150 ft with occasional reactionary balance Fwd/retro gait walking on unsteady surfaces 10x each way- some LOB with retro stepping [place] on unsteady surface 3x each way not as hard  THERAPEUTIC EXERCISE: To improve strength, endurance, ROM, and flexibility.  Nustep L5x10min Seated ankle EV and IV x 20 GTB Calf raise from 2' book x 20   12/17/23 NEUROMUSCULAR RE-EDUCATION: To improve coordination, kinesthesia, posture, and proprioception.  Step ups BOSU LLE x 10 Lateral step ups BOSU LLE x 10 Standing balance on BOSU BLE 2x30  THERAPEUTIC EXERCISE: To improve strength, endurance, ROM, and flexibility.  Nustep L6x53min Assessment of stairs: [facility], patient reports slight increase in pain w/ stairs Toe curls with towel on floor x 20 Standing gastroc and soleus stretch x 30' each with blue band dorsal glide Standing calf raises from 2' book    12/02/23 THERAPEUTIC EXERCISE: To improve strength, endurance, ROM, and flexibility.  Demonstration, verbal and tactile cues throughout for technique.  NuStep - L5 x 7 min (UE/LE) Tested strength Seated self-anchored RTB ankle eversion x 20 Seated self-anchored RTB ankle inversion x 20  THERAPEUTIC ACTIVITIES: To improve stair climbing ability  Step ups LLE 8' 2 x 10  Lateral step ups LLE 8' 2 x 10  NEUROMUSCULAR RE-EDUCATION: To improve balance and proprioception Tandem walk on balance beam fwd and back 3x Sidestep with EC on balance beam 3x both ways   11/25/23 THERAPEUTIC EXERCISE: To improve strength, endurance, ROM, and flexibility.  Demonstration, verbal and tactile cues throughout for technique.  NuStep - L6 x 6 min (UE/LE) Seated self-anchored RTB ankle eversion 2 x 10 Seated self-anchored RTB ankle DF 2 x 10 Seated self-anchored RTB ankle inversion 2 x 10 Seated self-anchored blue TB ankle PF 2 x 10 L ankle RTB toe curls 2 x 10  SELF CARE:  Provided education on post-op incisional scar tissue  mobilization and desensitization techniques rubbing with different textures .   THERAPEUTIC ACTIVITIES: To improve functional performance.  Demonstration, verbal and tactile cues throughout for technique. 5xSTS = 13.25 sec  NEUROMUSCULAR RE-EDUCATION: To improve coordination, kinesthesia, proprioception, amplitude of movement, and reduce rigidity. L ankle PNF D1 & D2 diagonals 3 x 10 - 1 set w/o resistance, 2nd set resisting DF diagonals, 3rd set resisting PF diagonals Bridge + RTB B hip ABD/ER clam (maintaining level pelvis) 2 x 10 Bridge + RTB march x 10   11/21/23 THERAPEUTIC EXERCISE: To improve strength and endurance.  Demonstration, verbal and tactile cues throughout for technique.  NuStep - L6 x 6 min (UE/LE) Seated self-anchored RTB ankle eversion 2 x 10 Seated self-anchored RTB ankle DF 2 x 10 Seated self-anchored RTB ankle inversion 2 x 10 Clamshell RTB x 20 R/L in sidelying  NEUROMUSCULAR RE-EDUCATION: To improve balance, coordination, kinesthesia, proprioception, and reduce fall risk. Standing hip abduction RTB at ankles x 10 B Standing hip extension RTB at ankles x 10 B Standing hip ABD to ext toe taps RTB at ankles x 10 B Sidestep 2x along counter RTB at ankles   11/18/23 THERAPEUTIC EXERCISE: To improve strength and endurance.  Demonstration, verbal and tactile cues throughout for technique.  NuStep - L6 x 6 min (UE/LE) Seated self-anchored YTB ankle eversion 2 x 10 Seated self-anchored YTB ankle DF  2 x 10 Seated self-anchored YTB ankle inversion 2 x 10 Seated self-anchored GTB ankle PF 2 x 10  NEUROMUSCULAR RE-EDUCATION: To improve balance, coordination, kinesthesia, proprioception, and reduce fall risk. Seated BAPS - L3 DF/PF 2 x 10 (keeping sides level) 2.5 weights on lateral, then medial side x 10 each IV/EV 2 x 10 - more difficult CW/CCW x 20 SLS 2 x 30 L - no UE support Standing L DF/knee flexion mobilization on slant board 5x3   11/13/23 THERAPEUTIC  EXERCISE: To improve strength and endurance.  Demonstration, verbal and tactile cues throughout for technique.  NuStep - L6 x 6 min (UE/LE) Seated self-anchored YTB ankle eversion 2 x 10 Seated self-anchored YTB ankle DF 2 x 10 Seated self-anchored YTB ankle inversion 2 x 10 Seated self-anchored GTB ankle PF 2 x 10  MANUAL THERAPY: To promote improved flexibility, improved joint mobility, increased ROM, and reduced pain utilizing joint mobilization.  L ankle subtalar and tarsal-metatarsal joint mobs - grade II-III  NEUROMUSCULAR RE-EDUCATION: To improve balance, coordination, kinesthesia, proprioception, and reduce fall risk. Seated BAPS - L3 DF/PF 2 x 10 (keeping sides level) IV/EV 2 x 10 (keeping front/back level) - more difficult CW/CCW x 10 SLS 2 x 30 L - intermittent light support from single pole   11/11/23  THERAPEUTIC EXERCISE: To improve strength, endurance, ROM, and flexibility.  Demonstration, verbal and tactile cues throughout for technique.  S/L clams w/ YTB at distal thighs 2x10 B  Seated Resisted DF AROM w/ YTB 2x10 B  Seated Resisted PF AROM w/ YTB 2x10 B  Seated Resisted ankle eversion w/ YTB 2x10 B   NEURO RE-ED (corner balance) Static Stance x30  Static Stance with eyes closed x30- L ankle feels less steady  Staggered stance x30 B- more difficult w/LLE back  Tandem Stance x30 B- harder with LLE back  SLS x30 B- much more difficult on LLE, pain in front and lateral ankle   SELF CARE: Provided education for HEP review including recommended frequency for home performance.  Reviewed HEP w/ patient  Gastroc Stretch 2x30  Soleus Stretch 2x30  Ankle circles x10 ea direction Heel/Toe Raises x20    10/27/2023  SELF CARE:  Reviewed eval findings and role of PT in addressing identified deficits as well as instruction in initial HEP (see below).    PATIENT EDUCATION:  Education details: HEP progression - clam progressed to side plank  Person educated:  Patient Education method: Explanation, Demonstration, Verbal cues, and MedBridgeGO app updated Education comprehension: verbalized understanding, returned demonstration, verbal cues required, and needs further education  HOME EXERCISE PROGRAM: *Pt using MedBridgeGO app.  Access Code: [email or web address] URL: [email or web address] Date: 12/30/2023 Prepared by: Elijah Hidden  Exercises - Gastroc Stretch on Wall (Mirrored)  - 2-3 x daily - 7 x weekly - 3 reps - 30 sec hold - Standing Soleus Stretch  - 2-3 x daily - 7 x weekly - 3 reps - 30 sec hold - Ankle Circles in Elevation  - 2-3 x daily - 7 x weekly - 2 sets - 10 reps - Seated Heel Toe Raises  - 2-3 x daily - 7 x weekly - 2 sets - 10 reps - 3 sec hold - Long Sitting Ankle Plantar Flexion with Resistance  - 1 x daily - 7 x weekly - 2 sets - 10 reps - 3 sec hold - Seated Ankle Dorsiflexion with Resistance  - 1 x daily - 7 x weekly - 2 sets - 10 reps - 3 sec  hold - Seated Ankle Eversion with Resistance  - 1 x daily - 7 x weekly - 2 sets - 10 reps - 3 sec hold - Seated Ankle Inversion with Resistance  - 1 x daily - 7 x weekly - 2 sets - 10 reps - 3 sec hold - Hip Abduction with Resistance Loop  - 1 x daily - 7 x weekly - 2 sets - 10 reps - Hip Extension with Resistance Loop  - 1 x daily - 7 x weekly - 2 sets - 10 reps - Side Plank with Clam and Resistance  - 1 x daily - 3 x weekly - 2-3 sets - 5-10 reps - 3 sec hold  Patient Education - Rubbing with Different Textures   ASSESSMENT:  CLINICAL IMPRESSION: Reviewed hip strengthening exercises from HEP and provided progressions. Pt reports L lateral ankle bruised with weight bearing, I tried peroneal KT taping to offload the lateral ankle, pt reported some relief with this.  Provided info on how to obtain KT tape and apply. She is nearing the end of her current POC and will hopefully be ready to transition to her HEP at that time.  EVAL: Dana [name] is a 28 y.o. female who was  referred to physical therapy for evaluation and treatment s/p correction hallux valgus left foot with metatarsus adductus correction and Akin osteotomy on 07/11/23.  Patient has been released from any postop restrictions and cleared to resume normal activity including running as tolerated.   Patient reports L foot ankle and calf pain persist since surgery.  Pain is worse with prolonged sitting or standing, or walking long distances.  Patient has deficits in L foot/ankle ROM, distal L LE flexibility, L>R LE strength, abnormal posture, and TTP with abnormal muscle tension which are interfering with ADLs and are impacting quality of life.  On LEFS patient scored 32/80 demonstrating moderate functional limitation.  [place] will benefit from skilled PT to address above deficits to improve mobility and activity tolerance with decreased pain interference prior to proceeding with surgery for R foot.  OBJECTIVE IMPAIRMENTS: Abnormal gait, decreased activity tolerance, decreased balance, decreased endurance, decreased knowledge of condition, decreased mobility, difficulty walking, decreased ROM, decreased strength, hypomobility, increased edema, increased fascial restrictions, impaired perceived functional ability, increased muscle spasms, impaired flexibility, impaired sensation, improper body mechanics, postural dysfunction, pain, and hypermobility.   ACTIVITY LIMITATIONS: carrying, lifting, bending, sitting, standing, squatting, sleeping, stairs, transfers, bathing, locomotion level, and caring for others  PARTICIPATION LIMITATIONS: meal prep, cleaning, laundry, shopping, community activity, occupation, and yard work  PERSONAL FACTORS: Past/current experiences, Profession, Time since onset of injury/illness/exacerbation, and 3+ comorbidities: anxiety, depression, asthma, Ehlers-Danlos/hypermobility syndrome, IBS are also affecting patient's functional outcome.   REHAB POTENTIAL: Good  CLINICAL DECISION MAKING:  Evolving/moderate complexity  EVALUATION COMPLEXITY: Moderate   GOALS: Goals reviewed with patient? Yes  SHORT TERM GOALS: Target date: 12/01/2023  Patient will be independent with initial HEP. Baseline: Initial HEP provided on eval Goal status: MET - 11/11/23 - pt is doing HEP consistently   2.  Patient will report at least 25% improvement in L foot/ankle/calf pain to improve QOL. Baseline: 2/10 on eval, up to 8/10 Goal status: MET - 11/18/23  3.  Patient will improve 5x STS time to </= 15 seconds to demonstrate improved functional strength and transfer efficiency.  Baseline: 20.25 sec Goal status: MET - 11/25/23 - 13.25 sec   LONG TERM GOALS: Target date: 01/05/2024  Patient will be independent with advanced/ongoing HEP to improve  outcomes and carryover.  Baseline:  Goal status: IN PROGRESS - 12/23/23 - HEP helping with pain and stiffness  2.  Patient will report at least 50-75% improvement in L foot/ankle/calf pain to improve QOL. Baseline: 2/10 on eval, up to 8/10 Goal status: MET - 12/02/23   3.  Patient will demonstrate improved L ankle AROM to [facility] to allow for normal gait and stair mechanics. Baseline: Refer to above LE ROM table Goal status: MET - 12/17/23  4.  Patient will demonstrate improved B LE strength to >/= 4+/5 with exception of L PF to >/= 3+/5 for improved stability and ease of mobility. Baseline: Refer to above LE MMT table Goal status: PARTIALLY MET - 12/30/23 - met except L hip ABD and ER 4/5  5.  Patient will be able to ambulate ~1 mile with normal gait pattern without increased foot/ankle pain to access community.  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 Patient will be able to ascend/descend stairs with 1 HR and reciprocal step pattern  safely to access home and community.  Baseline:  Goal status: PARTIALLY MET - 12/17/23 - increased pain with stairs   7.  Patient will report >/= 45/80 on LEFS to demonstrate improved functional ability. Baseline: 32 / 80 = 40.0 % Goal status: MET10/3/25  55 / 80 = 68.8 %  8.  Patient will improve L SLS to >/= 20 sec to demonstrate improved L ankle stability. Baseline: 10.78 sec 11/13/23 - L SLS with 2 x 30 with intermittent light 1 pole  Goal status: MET - 12/23/23 - R = 37.75 sec, L = 37.66 sec   PLAN:  PT FREQUENCY: 2x/week  PT DURATION: 8-10 weeks   PLANNED INTERVENTIONS: 02835- PT Re-evaluation, 97750- Physical Performance Testing, 97110-Therapeutic exercises, 97530- Therapeutic activity, 97112- Neuromuscular re-education, 97535- Self Care, 02859- Manual therapy, [id number]- Gait training, [phone]- Aquatic Therapy, [phone]- Electrical stimulation (unattended), [id number]- Vasopneumatic device, [id number]- Ultrasound, [id number]- Ionotophoresis 4mg /ml Dexamethasone , 79439 (1-2 muscles), 20561 (3+ muscles)- Dry Needling, Patient/Family education, Balance training, Stair training, Taping, Joint mobilization, Scar mobilization, DME instructions, Cryotherapy, and Moist heat  PLAN FOR NEXT SESSION: prepare for discharge   Sol LITTIE Gaskins, PTA 01/02/2024, 9:59 AM

## 2024-01-05 ENCOUNTER — Encounter: Payer: Self-pay | Admitting: Physical Therapy

## 2024-01-05 ENCOUNTER — Ambulatory Visit: Admitting: Physical Therapy

## 2024-01-05 DIAGNOSIS — M6281 Muscle weakness (generalized): Secondary | ICD-10-CM | POA: Diagnosis not present

## 2024-01-05 DIAGNOSIS — R600 Localized edema: Secondary | ICD-10-CM

## 2024-01-05 DIAGNOSIS — R2689 Other abnormalities of gait and mobility: Secondary | ICD-10-CM

## 2024-01-05 DIAGNOSIS — M25572 Pain in left ankle and joints of left foot: Secondary | ICD-10-CM

## 2024-01-05 NOTE — Therapy (Unsigned)
 OUTPATIENT PHYSICAL THERAPY TREATMENT / DISCHARGE SUMMARY   Patient Name: Dana [name] MRN: [id number] DOB:1995/05/25, 28 y.o., female Today's Date: 01/05/2024   END OF SESSION:  PT End of Session - 01/05/24 0853     Visit Number 14    Date for Recertification  01/05/24    Authorization Type [employer] & [facility] Medicaid    PT Start Time [phone]    PT Stop Time 0932    PT Time Calculation (min) 39 min    Activity Tolerance Patient tolerated treatment well    Behavior During Therapy [facility] for tasks assessed/performed                Past Medical History:  Diagnosis Date   Anxiety    Anxiety disorder of adolescence 06/09/2015   Arm fracture    Asthma    Constipation    alternates with diarrhea   Depression    Diarrhea    alternates with constipation   Ehlers-Danlos syndrome    per patient   GERD (gastroesophageal reflux disease)    Inflammatory bowel disease    Insomnia 06/09/2015   Past Surgical History:  Procedure Laterality Date   FOOT ARTHRODESIS Left 07/11/2023   Procedure: FUSION, JOINT, FOOT;  Surgeon: Silva Juliene SAUNDERS, DPM;  Location: [facility] ORS;  Service: Podiatry;  Laterality: Left;  MID FOOT FUSION   HALLUX VALGUS AKIN Left 07/11/2023   Procedure: CORRECTION, HALLUX VALGUS, WITH AKIN OSTEOTOMY;  Surgeon: Silva Juliene SAUNDERS, DPM;  Location: [facility] ORS;  Service: Podiatry;  Laterality: Left;  GENERAL WITH BLOCK   HALLUX VALGUS LAPIDUS Left 07/11/2023   Procedure: ROMAYNE LOOK;  Surgeon: Silva Juliene SAUNDERS, DPM;  Location: [facility] ORS;  Service: Podiatry;  Laterality: Left;   NO PAST SURGERIES     Patient Active Problem List   Diagnosis Date Noted   Metatarsus primus varus of left foot 07/13/2023   Acquired hallux valgus of left foot 07/13/2023   Metatarsus adductus of left foot 07/13/2023   Major osseous defect 07/13/2023   Otalgia of both ears 03/06/2023   Body aches 03/06/2023   Hypermobility syndrome 02/18/2023   Suicidal ideation 12/19/2015   MDD  (major depressive disorder), recurrent episode, severe (HCC) 12/18/2015   Insomnia 06/09/2015   Anxiety disorder of adolescence 06/09/2015   Severe episode of recurrent major depressive disorder, without psychotic features (HCC) 06/02/2015   Alternating constipation and diarrhea    Nausea & vomiting 09/29/2011   IBS (irritable bowel syndrome) 06/28/2011   GERD (gastroesophageal reflux disease) 06/28/2011   Back pain 08/01/2010   DYSURIA 08/17/2009   ACUTE PHARYNGITIS 04/06/2009   ACUTE BRONCHITIS 04/06/2009   Acute gastritis 04/27/2007   Anxiety state 02/11/2007   Allergic rhinitis 02/11/2007   GERD 12/25/2006   LOOSE STOOLS 12/25/2006   RUQ PAIN 12/25/2006    PCP: Antonio Cyndee Jamee SAUNDERS, DO   REFERRING PROVIDER: Silva Juliene SAUNDERS, DPM   REFERRING DIAG:  M20.12,M21.612 (ICD-10-CM) - Hallux valgus with bunions, left  Q66.229 (ICD-10-CM) - Metatarsus adductus, congenital   Postop bunionectomy 3 months ago difficulty ambulating and swelling in ankle, work on joint manipulation range of motion strengthening and gait training. Evaluate and treat modalities as needed at your discretion twice weekly for 6 to 8 weeks.   THERAPY DIAG:  Pain in left ankle and joints of left foot  Muscle weakness (generalized)  Other abnormalities of gait and mobility  Localized edema  RATIONALE FOR EVALUATION AND TREATMENT: Rehabilitation  ONSET DATE: 07/11/2023 Surgery/procedure:  LOOK Bunionectomy  Correction metatarsus adductus with 2nd and 3rd TMT fusion with osteotomy Akin phalangeal osteotomy  NEXT MD VISIT: 01/22/24   SUBJECTIVE:                                                                                                                                                                                                         SUBJECTIVE STATEMENT: Pt reports she has been walking 2-3 dogs on Tuesdays and Thursday. Some mild soreness afterwards with no dorsal ankle pain at this point,  but still having mild plantar heel pain with pressure which feels like a bruise.  PAIN: Are you having pain? No  PERTINENT HISTORY:  anxiety, depression, asthma, Ehlers-Danlos/hypermobility syndrome, IBS  PRECAUTIONS: None - No restrictions on activity. Regular shoe gear as tolerated.   RED FLAGS: None  WEIGHT BEARING RESTRICTIONS: No  FALLS:  Has patient fallen in last 6 months? Yes. 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   OBJECTIVE: (objective measures completed at initial evaluation unless otherwise dated)  DIAGNOSTIC FINDINGS:  Not available  PATIENT SURVEYS:  LEFS: 32 / 80 = 40.0 %, moderate functional limitation  COGNITION: Overall cognitive status: Within functional limits for tasks assessed    SENSATION: Continued numbness and tingling in L foot, ankle and calf since surgery (slightly better in calf)  EDEMA:  Figure 8: R = 48.0 cm, L = 50.0 cm    POSTURE:  B pes planus in standing  PALPATION: Decreased incisional scar mobility, most notable on medial incision Decreased mid and hindfoot mobility Increased muscle tension/tightness in L gastroc/soleus, fibularis muscles, and anterior tibialis  MUSCLE LENGTH:  Heelcord: Mild tight L>R  LOWER EXTREMITY ROM:  Active ROM Right eval Left eval L 11/21/23 L 12/17/23 L 01/05/24  Ankle dorsiflexion 12 1 10 15 21   Ankle plantarflexion 71 49 50 60 62  Ankle inversion 40 27 38 36 45  Ankle eversion 25 12 28 30 27    Passive ROM Left eval  Ankle dorsiflexion  6  Ankle plantarflexion 50  Ankle inversion 34  Ankle eversion 14  (Blank rows = not tested)  LOWER EXTREMITY MMT:  MMT Right eval Left eval R 12/02/23 L 12/02/23 R 12/30/23 L 12/30/23 R 01/05/24 L 01/05/24  Hip flexion 5 4- 5 5 5 5     Hip extension 4+ 3+ 4+ 4+ 5 5    Hip abduction 4+ 4- 4+ 4+ 4+ 4 4+ 4+  Hip adduction 4+ 4+ 4+ 4 5 4+ 5 5  Hip internal rotation 4+ 4- 4+ 4+ 5 5    Hip external rotation 4- 3+ 4+ 4+ 4+ 4 4+ 4+  Knee flexion 5 5   5 5     Knee extension 5 4+   5 5    Ankle dorsiflexion 5 3+ 5 4+ 5 5    Ankle plantarflexion 4- (8 SLS HR) 2 17 heel raises 13 Heel raises   14 Heel raises* 14 Heel raises*  Ankle inversion 5 3 5  4+ 5 5    Ankle eversion 5 3- 5 4 5  4+     (Blank rows = not tested, *01/05/24 - limited by cramping in buttocks)  FUNCTIONAL TESTS:  5 times sit to stand: 20.25 sec  SLS: R = 37.66 sec, L = 10.78 sec  GAIT: Distance walked: Clinic distances Assistive device utilized: None Level of assistance: Complete Independence Gait pattern: decreased step length- Right, decreased stance time- Left, decreased ankle dorsiflexion- Left, antalgic, and lateral lean- Right   TODAY'S TREATMENT:   01/05/2024  THERAPEUTIC EXERCISE: To improve strength, endurance, ROM, and flexibility.  Demonstration, verbal and tactile cues throughout for technique.  Rec Bike - L6 x 6 min L long-sitting plantar fascia stretch x 30 L seated figure-4 plantar fascia stretch x 30 Standing L negative heel plantar fascia stretch off edge of step x 30 - best stretch   NEUROMUSCULAR RE-EDUCATION: To improve balance, coordination, kinesthesia, and proprioception.  B negative heel raises over edge of step x 10 L eccentric negative heel raise over edge of step x 10  THERAPEUTIC ACTIVITIES: To improve functional performance.  Demonstration, verbal and tactile cues throughout for technique.  L ankle ROM assessment LE MMT Goal assessment Stairs: Level of Assistance: Complete  Independence Stair Negotiation Technique: Alternating Pattern  with No Rails Number of Stairs: 14  Height of Stairs: 7  Comments: normal reciprocal pattern  SELF CARE:   Verbally reviewed current HEP clarifying recommended frequency for ongoing performance to maintain gains achieved with PT and prevent future decline in function.    01/02/24 THERAPEUTIC EXERCISE: To improve strength, endurance, ROM, and flexibility.  Demonstration, verbal and tactile cues throughout for technique.  Bike L2x80min NEUROMUSCULAR RE-EDUCATION: To improve balance, coordination, kinesthesia, proprioception, and reduce fall risk.  Side planks with clamshell B Planks with hip ext B Standing hip abduction, extension RTB at ankles 2 x 10 B Resisted walking fwd with perturbation, lateral walking with perturbations KT tape for L ankle one strip bottom of foot along peroneus longus, cross strip along dorsum of ankle across lateral malleolus    12/30/23 THERAPEUTIC EXERCISE: To improve strength, endurance, ROM, and flexibility.  Demonstration, verbal and tactile cues throughout for technique.  Elliptical - L2.0 x 6 min  MANUAL THERAPY: To promote reduced pain and increased stability utilizing kinesiotaping.  K-Tape to L ankle - 2 I strips: Stirrup to medial and lateral ankle ~50% 30% along posterior tibialis  THERAPEUTIC ACTIVITIES: To improve functional performance.  Demonstration, verbal and tactile cues throughout for technique. Resisted gait with waist belt to simulate her dog walking belt 2 x 50 each - forward, back and L/R LE MMT  NEUROMUSCULAR RE-EDUCATION: To improve balance, coordination, kinesthesia, proprioception, and reduce fall risk.  L SLS + B GTB pallof press x 15 each direction Side plank + GTB hip ABD/ER clam   12/26/23 Bike L6x54min  Resisted walking Black TB with lateral perturbations 172ft 3x Step downs 8' leading RLE 2x10 Squats on upside down BOSU x 10 SLS with shoulder ext RTB  2x10 SLS with lateral oscillations  x 10- harder L SLS on BOSU ball 2x10- intermittent UE support   12/23/23 THERAPEUTIC EXERCISE: To improve strength and endurance.   NuStep - L6 x 6 min (UE/LE)  THERAPEUTIC ACTIVITIES: To improve functional performance.  Demonstration, verbal and tactile cues throughout for technique. SLS: R = 37.75 sec, L = 37.66 sec w/o UE support  MANUAL THERAPY: To promote normalized muscle tension, improved flexibility, improved joint mobility, increased ROM, and reduced pain utilizing joint mobilization, connective tissue massage, therapeutic massage, myofascial release, and scar mobilization. L ankle talocrural, intertarsal, and tarsal-metatarsal mobilization STM/DTM and XFM to dorsum of L ankle and distal anterior tibialis  NEUROMUSCULAR RE-EDUCATION: To improve balance, coordination, kinesthesia, posture, proprioception, and reduce fall risk. SLS + GTB 4-way SLR x 10 bil, rare/occasional UE support on back of chair for balance   12/19/23 NEUROMUSCULAR RE-EDUCATION: To improve coordination, kinesthesia, posture, and proprioception.  Step ups BOSU LLE x 20 Lateral step ups BOSU LLE x 20 Resisted gait black TB with perturbations side to side 2x150'; additional 150 ft with occasional reactionary balance Fwd/retro gait walking on unsteady surfaces 10x each way- some LOB with retro stepping [place] on unsteady surface 3x each way not as hard  THERAPEUTIC EXERCISE: To improve strength, endurance, ROM, and flexibility.  Nustep L5x45min Seated ankle EV and IV x 20 GTB Calf raise from 2' book x 20   12/17/23 NEUROMUSCULAR RE-EDUCATION: To improve coordination, kinesthesia, posture, and proprioception.  Step ups BOSU LLE x 10 Lateral step ups BOSU LLE x 10 Standing balance on BOSU BLE 2x30  THERAPEUTIC EXERCISE: To improve strength, endurance, ROM, and flexibility.  Nustep L6x24min Assessment of stairs: [facility], patient reports slight increase in pain w/  stairs Toe curls with towel on floor x 20 Standing gastroc and soleus stretch x 30' each with blue band dorsal glide Standing calf raises from 2' book    12/02/23 THERAPEUTIC EXERCISE: To improve strength, endurance, ROM, and flexibility.  Demonstration, verbal and tactile cues throughout for technique.  NuStep - L5 x 7 min (UE/LE) Tested strength Seated self-anchored RTB ankle eversion x 20 Seated self-anchored RTB ankle inversion x 20  THERAPEUTIC ACTIVITIES: To improve stair climbing ability  Step ups LLE 8' 2 x 10  Lateral step ups LLE 8' 2 x 10  NEUROMUSCULAR RE-EDUCATION: To improve balance and proprioception Tandem walk on balance beam fwd and back 3x Sidestep with EC on balance beam 3x both ways   11/25/23 THERAPEUTIC EXERCISE: To improve strength, endurance, ROM, and flexibility.  Demonstration, verbal and tactile cues throughout for technique.  NuStep - L6 x 6 min (UE/LE) Seated self-anchored RTB ankle eversion 2 x 10 Seated self-anchored RTB ankle DF 2 x 10 Seated self-anchored RTB ankle inversion 2 x 10 Seated self-anchored blue TB ankle PF 2 x 10 L ankle RTB toe curls 2 x 10  SELF CARE:  Provided education on post-op incisional scar tissue mobilization and desensitization techniques rubbing with different textures .   THERAPEUTIC ACTIVITIES: To improve functional performance.  Demonstration, verbal and tactile cues throughout for technique. 5xSTS = 13.25 sec  NEUROMUSCULAR RE-EDUCATION: To improve coordination, kinesthesia, proprioception, amplitude of movement, and reduce rigidity. L ankle PNF D1 & D2 diagonals 3 x 10 - 1 set w/o resistance, 2nd set resisting DF diagonals, 3rd set resisting PF diagonals Bridge + RTB B hip ABD/ER clam (maintaining level pelvis) 2 x 10 Bridge + RTB march x 10   11/21/23 THERAPEUTIC EXERCISE: To improve strength and endurance.  Demonstration, verbal and tactile cues throughout for technique.  NuStep - L6 x 6 min (UE/LE) Seated  self-anchored RTB ankle eversion 2 x 10 Seated self-anchored RTB ankle DF 2 x 10 Seated self-anchored RTB ankle inversion 2 x 10 Clamshell RTB x 20 R/L in sidelying  NEUROMUSCULAR RE-EDUCATION: To improve balance, coordination, kinesthesia, proprioception, and reduce fall risk. Standing hip abduction RTB at ankles x 10 B Standing hip extension RTB at ankles x 10 B Standing hip ABD to ext toe taps RTB at ankles x 10 B Sidestep 2x along counter RTB at ankles   11/18/23 THERAPEUTIC EXERCISE: To improve strength and endurance.  Demonstration, verbal and tactile cues throughout for technique.  NuStep - L6 x 6 min (UE/LE) Seated self-anchored YTB ankle eversion 2 x 10 Seated self-anchored YTB ankle DF 2 x 10 Seated self-anchored YTB ankle inversion 2 x 10 Seated self-anchored GTB ankle PF 2 x 10  NEUROMUSCULAR RE-EDUCATION: To improve balance, coordination, kinesthesia, proprioception, and reduce fall risk. Seated BAPS - L3 DF/PF 2 x 10 (keeping sides level) 2.5 weights on lateral, then medial side x 10 each IV/EV 2 x 10 - more difficult CW/CCW x 20 SLS 2 x 30 L - no UE support Standing L DF/knee flexion mobilization on slant board 5x3   11/13/23 THERAPEUTIC EXERCISE: To improve strength and endurance.  Demonstration, verbal and tactile cues throughout for technique.  NuStep - L6 x 6 min (UE/LE) Seated self-anchored YTB ankle eversion 2 x 10 Seated self-anchored YTB ankle DF 2 x 10 Seated self-anchored YTB ankle inversion 2 x 10 Seated self-anchored GTB ankle PF 2 x 10  MANUAL THERAPY: To promote improved flexibility, improved joint mobility, increased ROM, and reduced pain utilizing joint mobilization.  L ankle subtalar and tarsal-metatarsal joint mobs - grade II-III  NEUROMUSCULAR RE-EDUCATION: To improve balance, coordination, kinesthesia, proprioception, and reduce fall risk. Seated BAPS - L3 DF/PF 2 x 10 (keeping sides level) IV/EV 2 x 10 (keeping front/back level) - more  difficult CW/CCW x 10 SLS 2 x 30 L - intermittent light support from single pole   11/11/23  THERAPEUTIC EXERCISE: To improve strength, endurance, ROM, and flexibility.  Demonstration, verbal and tactile cues throughout for technique.  S/L clams w/ YTB at distal thighs 2x10 B  Seated Resisted DF AROM w/ YTB 2x10 B  Seated Resisted PF AROM w/ YTB 2x10 B  Seated Resisted ankle eversion w/ YTB 2x10 B   NEURO RE-ED (corner balance) Static Stance x30  Static Stance with eyes closed x30- L ankle feels less steady  Staggered stance x30 B- more difficult w/LLE back  Tandem Stance x30 B- harder with LLE back  SLS x30 B- much more difficult on LLE, pain in front and lateral ankle   SELF CARE: Provided education for HEP review including recommended frequency for home performance.  Reviewed HEP w/ patient  Gastroc Stretch 2x30  Soleus Stretch 2x30  Ankle circles x10 ea direction Heel/Toe Raises x20    10/27/2023  SELF CARE:  Reviewed eval findings and role of PT in addressing identified deficits as well as instruction in  initial HEP (see below).    PATIENT EDUCATION:  Education details: progress with PT, HEP review, HEP progression, HEP modification, HEP consolidation, and recommended frequency for ongoing HEP at discharge to prevent loss of gains achieved with PT  Person educated: Patient Education method: Explanation, Demonstration, Verbal cues, and MedBridgeGO app updated Education comprehension: verbalized understanding and returned demonstration  HOME EXERCISE PROGRAM: *Pt using MedBridge[employer] app.  Access Code: [email or web address] URL: [email or web address] Date: 01/05/2024 Prepared by: Elijah Hidden  Exercises - Gastroc Stretch on Wall (Mirrored)  - 2-3 x daily - 7 x weekly - 3 reps - 30 sec hold - Standing Soleus Stretch  - 2-3 x daily - 7 x weekly - 3 reps - 30 sec hold - Plantar Fascia Stretch on Step  - 2-3 x daily - 7 x weekly - 3 reps - 30 sec hold - Long Sitting  Ankle Plantar Flexion with Resistance  - 1 x daily - 3 x weekly - 2 sets - 10 reps - 3 sec hold - Seated Ankle Dorsiflexion with Resistance  - 1 x daily - 3 x weekly - 2 sets - 10 reps - 3 sec hold - Seated Ankle Eversion with Resistance  - 1 x daily - 3 x weekly - 2 sets - 10 reps - 3 sec hold - Seated Ankle Inversion with Resistance  - 1 x daily - 3 x weekly - 2 sets - 10 reps - 3 sec hold - Hip Abduction with Resistance Loop  - 1 x daily - 3 x weekly - 2 sets - 10 reps - Hip Extension with Resistance Loop  - 1 x daily - 3 x weekly - 2 sets - 10 reps - Side Stepping with Resistance at Ankles  - 1 x daily - 3 x weekly - 2 sets - 10 reps - Side Stepping with Resistance at Feet  - 1 x daily - 3 x weekly - 2 sets - 10 reps - Side Plank with Clam and Resistance  - 1 x daily - 3 x weekly - 2-3 sets - 5-10 reps - 3 sec hold - Standing Eccentric Heel Raise From Step With Towel Under Toes  - 1 x daily - 3 x weekly - 2 sets - 10 reps - 3 sec hold  Patient Education - Rubbing with Different Textures   ASSESSMENT:  CLINICAL IMPRESSION: Makalah has demonstrated excellent progress with PT with restoration of full L foot/ankle ROM and overall B LE strength of 4+ to 5/5.  She reports L ankle pain mostly resolved other than some recent L inferior/plantar heel pain.  Provided instructions in plantar fascia stretching with patient noting this seemed to target area of current pain.  We also reviewed, updated/progressed and consolidated her HEP, clarifying recommended frequency for ongoing performance to to continue to address any remaining deficits, maintain gains achieved with PT and prevent future decline in function.  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  All PT goals now met or partially met and Maille feels ready to transition to her HEP,  therefore will proceed with discharge from physical therapy for this episode.   EVAL: Aundreya L Fong is a 28 y.o. female who was referred to physical therapy for evaluation and treatment s/p correction hallux valgus left foot with metatarsus adductus correction and Akin osteotomy on 07/11/23.  Patient  has been released from any postop restrictions and cleared to resume normal activity including running as tolerated.   Patient reports L foot ankle and calf pain persist since surgery.  Pain is worse with prolonged sitting or standing, or walking long distances.  Patient has deficits in L foot/ankle ROM, distal L LE flexibility, L>R LE strength, abnormal posture, and TTP with abnormal muscle tension which are interfering with ADLs and are impacting quality of life.  On LEFS patient scored 32/80 demonstrating moderate functional limitation.  Rhayne will benefit from skilled PT to address above deficits to improve mobility and activity tolerance with decreased pain interference prior to proceeding with surgery for R foot.  OBJECTIVE IMPAIRMENTS: Abnormal gait, decreased activity tolerance, decreased balance, decreased endurance, decreased knowledge of condition, decreased mobility, difficulty walking, decreased ROM, decreased strength, hypomobility, increased edema, increased fascial restrictions, impaired perceived functional ability, increased muscle spasms, impaired flexibility, impaired sensation, improper body mechanics, postural dysfunction, pain, and hypermobility.   ACTIVITY LIMITATIONS: carrying, lifting, bending, sitting, standing, squatting, sleeping, stairs, transfers, bathing, locomotion level, and caring for others  PARTICIPATION LIMITATIONS: meal prep, cleaning, laundry, shopping, community activity, occupation, and yard work  PERSONAL FACTORS: Past/current experiences, Profession, Time since onset of injury/illness/exacerbation, and 3+ comorbidities: anxiety, depression, asthma,  Ehlers-Danlos/hypermobility syndrome, IBS are also affecting patient's functional outcome.   REHAB POTENTIAL: Good  CLINICAL DECISION MAKING: Evolving/moderate complexity  EVALUATION COMPLEXITY: Moderate   GOALS: Goals reviewed with patient? Yes  SHORT TERM GOALS: Target date: 12/01/2023  Patient will be independent with initial HEP. Baseline: Initial HEP provided on eval Goal status: MET - 11/11/23 - pt is doing HEP consistently   2.  Patient will report at least 25% improvement in L foot/ankle/calf pain to improve QOL. Baseline: 2/10 on eval, up to 8/10 Goal status: MET - 11/18/23  3.  Patient will improve 5x STS time to </= 15 seconds to demonstrate improved functional strength and transfer efficiency.  Baseline: 20.25 sec Goal status: MET - 11/25/23 - 13.25 sec   LONG TERM GOALS: Target date: 01/05/2024  Patient will be independent with advanced/ongoing HEP to improve outcomes and carryover.  Baseline:  12/23/23 - HEP helping with pain and stiffness Goal status: MET - 01/05/24  2.  Patient will report at least 50-75% improvement in L foot/ankle/calf pain to improve QOL. Baseline: 2/10 on eval, up to 8/10 Goal status: MET - 12/02/23   3.  Patient will demonstrate improved L ankle AROM to [facility] to allow for normal gait and stair mechanics. Baseline: Refer to above LE ROM table Goal status: MET - 12/17/23  4.  Patient will demonstrate improved B LE strength to >/= 4+/5 with exception of L PF to >/= 3+/5 for improved stability and ease of mobility. Baseline: Refer to above LE MMT table 12/30/23 - met except L hip ABD and ER 4/5 Goal status: MET - 01/05/24  5.  Patient will be able to ambulate ~1 mile with normal gait pattern without increased foot/ankle pain to access community.  Baseline: Antalgic gait pattern with gait distance limited to only a few blocks 12/23/23 - pt reports she walked 1 mile yesterday but felt like that was too much; also only able to walk 2 dogs at a time  now for her dog-walking business Goal status: PARTIALLY MET - 01/05/24 - Able to walk 1 mile but does note some soreness afterwards  6. Patient will be able to ascend/descend stairs with 1 HR and reciprocal step pattern safely to access home and  community.  Baseline:  12/17/23 - increased pain with stairs  Goal status: MET - 01/05/24  7.  Patient will report >/= 45/80 on LEFS to demonstrate improved functional ability. Baseline: 32 / 80 = 40.0 % Goal status: MET - 12/26/23 - 55 / 80 = 68.8 %  8.  Patient will improve L SLS to >/= 20 sec to demonstrate improved L ankle stability. Baseline: 10.78 sec 11/13/23 - L SLS with 2 x 30 with intermittent light 1 pole  Goal status: MET - 12/23/23 - R = 37.75 sec, L = 37.66 sec   PLAN:  PT FREQUENCY: 2x/week  PT DURATION: 8-10 weeks   PLANNED INTERVENTIONS: 02835- PT Re-evaluation, 97750- Physical Performance Testing, 97110-Therapeutic exercises, 97530- Therapeutic activity, 97112- Neuromuscular re-education, 97535- Self Care, [id number]- Manual therapy, [id number] Gait training, [phone]- Aquatic Therapy, [phone]- Electrical stimulation (unattended), 97016- Vasopneumatic device, [id number]- Ultrasound, [id number]- Ionotophoresis 4mg /ml Dexamethasone , 79439 (1-2 muscles), 20561 (3+ muscles)- Dry Needling, Patient/Family education, Balance training, Stair training, Taping, Joint mobilization, Scar mobilization, DME instructions, Cryotherapy, and Moist heat  PLAN FOR NEXT SESSION: transition to HEP + discharge from PT   PHYSICAL THERAPY DISCHARGE SUMMARY  Visits from Start of Care: 14  Current functional level related to goals / functional outcomes: Refer to above clinical impression and goal assessment.    Remaining deficits: Mild L foot/ankle soreness after walking 2-3 dogs x 1 mile   Education / Equipment: HEP, desensitization techniques  Patient agrees to discharge. Patient goals were mostly met. Patient is being discharged due to meeting the stated rehab  goals.   Elijah CHRISTELLA Hidden, PT 01/05/2024, 9:27 AM

## 2024-01-22 ENCOUNTER — Ambulatory Visit: Admitting: Podiatry

## 2024-01-27 ENCOUNTER — Ambulatory Visit: Admitting: Podiatry

## 2024-01-27 ENCOUNTER — Ambulatory Visit

## 2024-01-27 VITALS — Ht 63.0 in | Wt 153.0 lb

## 2024-01-27 DIAGNOSIS — M21612 Bunion of left foot: Secondary | ICD-10-CM | POA: Diagnosis not present

## 2024-01-27 DIAGNOSIS — M2012 Hallux valgus (acquired), left foot: Secondary | ICD-10-CM

## 2024-01-27 NOTE — Progress Notes (Signed)
  Subjective:  Patient ID: Dana [name], female    DOB: 1996/01/19,  MRN: [id number]  Chief Complaint  Patient presents with   Bunions    RM 1 Patient is here to f/u on left foot bunion. Pt states some pain when left foot feel tight and pain dissipates after stretching.    DOS: 07/11/2023 Procedure: Correction hallux valgus left foot with metatarsus adductus correction and Akin osteotomy  28 y.o. female returns for post-op check.  Most of the discomfort starts early in the morning and feel stiff and then gets better through the day  Review of Systems: Negative except as noted in the HPI. Denies N/V/F/Ch.   Objective:  There were no vitals filed for this visit. Body mass index is 27.1 kg/m. Constitutional Well developed. Well nourished.  Vascular Foot warm and well perfused. Capillary refill normal to all digits.  Calf is soft and supple, no posterior calf or knee pain, negative Homans' sign  Neurologic Normal speech. Oriented to person, place, and time. Epicritic sensation to light touch grossly present bilaterally.  Dermatologic Incision is well-healed not hypertrophic  Orthopedic: No pain to palpation of the ankle joint today.  Good range of motion of the MTP.  No pain in the toes or forefoot.  Hallux valgus and metatarsus adductus deformity on the right foot.   Multiple view plain film radiographs: Good correction noted from preoperative films she has consolidation across the fusion site and there is new cortical thickening around the second metatarsal consistent with a healed stress fracture  Her right foot radiographs reviewed today previously taken shows hallux valgus deformity and significant metatarsus adductus deformity Assessment:   1. Hallux valgus with bunions, left    Plan:  Patient was evaluated and treated and all questions answered.  S/p foot surgery left - Still doing well pain is mostly resolved.  She did develop a stress fracture which has healed and is  asymptomatic at this point of the second metatarsal.  Follow-up as needed for the left foot.  OTC meds as needed for any discomfort.  Medialize any shoe and activity as tolerated  She is not quite ready to schedule right foot surgery she will call when she is ready to schedule it she wants this 1 to be 100% as well as time with a new job she is starting   Surgical plan:  Procedure: - Right foot Lapidus bunionectomy, metatarsal adductus correction with osteotomy and midfoot fusion, Akin osteotomy, possible bone graft  Location: - [facility]  Anesthesia plan: - General With regional block  Postoperative pain plan: - Tylenol  1000 mg every 6 hours, ibuprofen  600 mg every 6 hours, gabapentin  300 mg every 8 hours x5 days, oxycodone  5 mg 1-2 tabs every 6 hours only as needed  DVT prophylaxis: - Aspirin  325 mg twice daily  WB Restrictions / DME needs: - Nonweightbearing in splint postop with knee scooter.    Return if symptoms worsen or fail to improve.

## 2024-01-29 DIAGNOSIS — Z713 Dietary counseling and surveillance: Secondary | ICD-10-CM | POA: Diagnosis not present

## 2024-02-02 DIAGNOSIS — F411 Generalized anxiety disorder: Secondary | ICD-10-CM | POA: Diagnosis not present

## 2024-02-09 DIAGNOSIS — F411 Generalized anxiety disorder: Secondary | ICD-10-CM | POA: Diagnosis not present

## 2024-02-17 DIAGNOSIS — Z713 Dietary counseling and surveillance: Secondary | ICD-10-CM | POA: Diagnosis not present

## 2024-02-23 DIAGNOSIS — F411 Generalized anxiety disorder: Secondary | ICD-10-CM | POA: Diagnosis not present

## 2024-03-01 DIAGNOSIS — F411 Generalized anxiety disorder: Secondary | ICD-10-CM | POA: Diagnosis not present

## 2024-03-08 DIAGNOSIS — F411 Generalized anxiety disorder: Secondary | ICD-10-CM | POA: Diagnosis not present

## 2024-03-09 DIAGNOSIS — Z713 Dietary counseling and surveillance: Secondary | ICD-10-CM | POA: Diagnosis not present

## 2024-03-15 DIAGNOSIS — F411 Generalized anxiety disorder: Secondary | ICD-10-CM | POA: Diagnosis not present

## 2024-04-26 ENCOUNTER — Telehealth: Payer: Self-pay

## 2024-04-26 DIAGNOSIS — Z8619 Personal history of other infectious and parasitic diseases: Secondary | ICD-10-CM

## 2024-04-26 MED ORDER — VALACYCLOVIR HCL 1 G PO TABS: 1000.0000 mg | ORAL_TABLET | Freq: Two times a day (BID) | ORAL | 1 refills | Status: AC

## 2024-04-26 NOTE — Telephone Encounter (Signed)
 Copied from CRM [id number]. Topic: Clinical - Medication Refill >> Apr 26, 2024 11:28 AM China J wrote: Medication:  valACYclovir  (VALTREX ) 1000 MG tablet   Has the patient contacted their pharmacy? Yes (Agent: If no, request that the patient contact the pharmacy for the refill. If patient does not wish to contact the pharmacy document the reason why and proceed with request.) (Agent: If yes, when and what did the pharmacy advise?) Pharmacy calling for refill.  This is the patient's preferred pharmacy:  [id number] - [place], [place] - [address] AT [facility] [address] [place] [address] Phone: [phone] Fax: [phone]  Is this the correct pharmacy for this prescription? Yes If no, delete pharmacy and type the correct one.   Has the prescription been filled recently? No  Is the patient out of the medication? Yes  Has the patient been seen for an appointment in the last year OR does the patient have an upcoming appointment? Yes  Can we respond through MyChart? Yes  Agent: Please be advised that Rx refills may take up to 3 business days. We ask that you follow-up with your pharmacy.

## 2024-04-27 ENCOUNTER — Encounter: Payer: Self-pay | Admitting: Family Medicine
